# Patient Record
Sex: Male | Born: 1935 | Race: White | Hispanic: No | Smoking: Former smoker
Health system: Southern US, Community
[De-identification: ages and names within clinical notes are randomized; demographics above are authoritative.]

## PROBLEM LIST (undated history)

## (undated) DIAGNOSIS — D510 Vitamin B12 deficiency anemia due to intrinsic factor deficiency: Secondary | ICD-10-CM

## (undated) DIAGNOSIS — K4090 Unilateral inguinal hernia, without obstruction or gangrene, not specified as recurrent: Secondary | ICD-10-CM

## (undated) DIAGNOSIS — E785 Hyperlipidemia, unspecified: Secondary | ICD-10-CM

## (undated) DIAGNOSIS — C61 Malignant neoplasm of prostate: Secondary | ICD-10-CM

## (undated) DIAGNOSIS — J189 Pneumonia, unspecified organism: Secondary | ICD-10-CM

## (undated) DIAGNOSIS — K5792 Diverticulitis of intestine, part unspecified, without perforation or abscess without bleeding: Secondary | ICD-10-CM

## (undated) DIAGNOSIS — F0390 Unspecified dementia without behavioral disturbance: Secondary | ICD-10-CM

## (undated) DIAGNOSIS — I10 Essential (primary) hypertension: Secondary | ICD-10-CM

## (undated) DIAGNOSIS — I2510 Atherosclerotic heart disease of native coronary artery without angina pectoris: Secondary | ICD-10-CM

## (undated) DIAGNOSIS — I7090 Unspecified atherosclerosis: Secondary | ICD-10-CM

## (undated) DIAGNOSIS — R260 Ataxic gait: Secondary | ICD-10-CM

## (undated) DIAGNOSIS — D649 Anemia, unspecified: Secondary | ICD-10-CM

## (undated) DIAGNOSIS — K5790 Diverticulosis of intestine, part unspecified, without perforation or abscess without bleeding: Secondary | ICD-10-CM

## (undated) DIAGNOSIS — Z87442 Personal history of urinary calculi: Secondary | ICD-10-CM

## (undated) HISTORY — DX: Diverticulitis of intestine, part unspecified, without perforation or abscess without bleeding: K57.92

## (undated) HISTORY — DX: Atherosclerotic heart disease of native coronary artery without angina pectoris: I25.10

## (undated) HISTORY — DX: Hyperlipidemia, unspecified: E78.5

## (undated) HISTORY — PX: JOINT REPLACEMENT: SHX530

## (undated) HISTORY — DX: Malignant neoplasm of prostate: C61

## (undated) HISTORY — DX: Pneumonia, unspecified organism: J18.9

## (undated) HISTORY — PX: PROSTATE SURGERY: SHX751

## (undated) HISTORY — DX: Personal history of urinary calculi: Z87.442

## (undated) HISTORY — DX: Diverticulosis of intestine, part unspecified, without perforation or abscess without bleeding: K57.90

## (undated) HISTORY — DX: Essential (primary) hypertension: I10

## (undated) HISTORY — PX: TRANSURETHRAL RESECTION OF PROSTATE: SHX73

---

## 1997-04-02 HISTORY — PX: CORONARY ANGIOPLASTY WITH STENT PLACEMENT: SHX49

## 1997-11-06 ENCOUNTER — Inpatient Hospital Stay (HOSPITAL_COMMUNITY): Admission: AD | Admit: 1997-11-06 | Discharge: 1997-11-09 | Payer: Self-pay | Admitting: Cardiovascular Disease

## 1997-11-23 ENCOUNTER — Encounter (HOSPITAL_COMMUNITY): Admission: RE | Admit: 1997-11-23 | Discharge: 1998-02-21 | Payer: Self-pay | Admitting: Cardiovascular Disease

## 2000-05-27 ENCOUNTER — Encounter (HOSPITAL_COMMUNITY): Admission: RE | Admit: 2000-05-27 | Discharge: 2000-08-25 | Payer: Self-pay | Admitting: Cardiovascular Disease

## 2000-08-26 ENCOUNTER — Encounter (HOSPITAL_COMMUNITY): Admission: RE | Admit: 2000-08-26 | Discharge: 2000-11-24 | Payer: Self-pay | Admitting: Cardiovascular Disease

## 2000-11-25 ENCOUNTER — Encounter (HOSPITAL_COMMUNITY): Admission: RE | Admit: 2000-11-25 | Discharge: 2001-02-23 | Payer: Self-pay | Admitting: Cardiovascular Disease

## 2000-11-27 ENCOUNTER — Encounter: Payer: Self-pay | Admitting: Urology

## 2000-11-27 ENCOUNTER — Ambulatory Visit (HOSPITAL_COMMUNITY): Admission: RE | Admit: 2000-11-27 | Discharge: 2000-11-27 | Payer: Self-pay | Admitting: Urology

## 2001-03-02 ENCOUNTER — Encounter (HOSPITAL_COMMUNITY): Admission: RE | Admit: 2001-03-02 | Discharge: 2001-05-31 | Payer: Self-pay | Admitting: Cardiovascular Disease

## 2001-06-01 ENCOUNTER — Encounter (HOSPITAL_COMMUNITY): Admission: RE | Admit: 2001-06-01 | Discharge: 2001-08-30 | Payer: Self-pay | Admitting: Cardiovascular Disease

## 2001-08-31 ENCOUNTER — Encounter (HOSPITAL_COMMUNITY): Admission: RE | Admit: 2001-08-31 | Discharge: 2001-11-29 | Payer: Self-pay | Admitting: Cardiovascular Disease

## 2001-12-01 ENCOUNTER — Encounter (HOSPITAL_COMMUNITY): Admission: RE | Admit: 2001-12-01 | Discharge: 2002-03-01 | Payer: Self-pay | Admitting: Cardiovascular Disease

## 2002-03-02 ENCOUNTER — Encounter (HOSPITAL_COMMUNITY): Admission: RE | Admit: 2002-03-02 | Discharge: 2002-05-31 | Payer: Self-pay | Admitting: Cardiovascular Disease

## 2002-06-01 ENCOUNTER — Encounter (HOSPITAL_COMMUNITY): Admission: RE | Admit: 2002-06-01 | Discharge: 2002-08-30 | Payer: Self-pay | Admitting: Cardiovascular Disease

## 2002-09-01 ENCOUNTER — Encounter (HOSPITAL_COMMUNITY): Admission: RE | Admit: 2002-09-01 | Discharge: 2002-11-30 | Payer: Self-pay | Admitting: Cardiovascular Disease

## 2002-12-02 ENCOUNTER — Encounter (HOSPITAL_COMMUNITY): Admission: RE | Admit: 2002-12-02 | Discharge: 2003-03-02 | Payer: Self-pay | Admitting: Cardiovascular Disease

## 2003-03-03 ENCOUNTER — Encounter (HOSPITAL_COMMUNITY): Admission: RE | Admit: 2003-03-03 | Discharge: 2003-06-01 | Payer: Self-pay | Admitting: Cardiovascular Disease

## 2003-06-02 ENCOUNTER — Encounter (HOSPITAL_COMMUNITY): Admission: RE | Admit: 2003-06-02 | Discharge: 2003-08-31 | Payer: Self-pay | Admitting: Cardiovascular Disease

## 2003-09-01 ENCOUNTER — Encounter (HOSPITAL_COMMUNITY): Admission: RE | Admit: 2003-09-01 | Discharge: 2003-11-30 | Payer: Self-pay | Admitting: Cardiovascular Disease

## 2003-09-14 ENCOUNTER — Encounter: Admission: RE | Admit: 2003-09-14 | Discharge: 2003-09-14 | Payer: Self-pay | Admitting: Orthopedic Surgery

## 2003-11-11 ENCOUNTER — Inpatient Hospital Stay (HOSPITAL_COMMUNITY): Admission: RE | Admit: 2003-11-11 | Discharge: 2003-11-12 | Payer: Self-pay | Admitting: Orthopedic Surgery

## 2003-12-17 ENCOUNTER — Encounter: Admission: RE | Admit: 2003-12-17 | Discharge: 2003-12-17 | Payer: Self-pay | Admitting: Orthopedic Surgery

## 2004-04-11 ENCOUNTER — Encounter (HOSPITAL_COMMUNITY): Admission: RE | Admit: 2004-04-11 | Discharge: 2004-07-10 | Payer: Self-pay | Admitting: Cardiovascular Disease

## 2004-05-08 ENCOUNTER — Ambulatory Visit (HOSPITAL_COMMUNITY): Admission: RE | Admit: 2004-05-08 | Discharge: 2004-05-08 | Payer: Self-pay | Admitting: Orthopedic Surgery

## 2004-05-08 ENCOUNTER — Ambulatory Visit (HOSPITAL_BASED_OUTPATIENT_CLINIC_OR_DEPARTMENT_OTHER): Admission: RE | Admit: 2004-05-08 | Discharge: 2004-05-08 | Payer: Self-pay | Admitting: Orthopedic Surgery

## 2004-07-31 ENCOUNTER — Encounter (HOSPITAL_COMMUNITY): Admission: RE | Admit: 2004-07-31 | Discharge: 2004-10-29 | Payer: Self-pay | Admitting: Cardiovascular Disease

## 2004-10-31 ENCOUNTER — Encounter (HOSPITAL_COMMUNITY): Admission: RE | Admit: 2004-10-31 | Discharge: 2005-01-29 | Payer: Self-pay | Admitting: Cardiovascular Disease

## 2005-01-26 ENCOUNTER — Ambulatory Visit: Payer: Self-pay | Admitting: Internal Medicine

## 2005-01-29 ENCOUNTER — Ambulatory Visit: Payer: Self-pay | Admitting: Internal Medicine

## 2005-01-31 ENCOUNTER — Encounter (HOSPITAL_COMMUNITY): Admission: RE | Admit: 2005-01-31 | Discharge: 2005-05-01 | Payer: Self-pay | Admitting: Cardiovascular Disease

## 2005-04-02 DIAGNOSIS — J189 Pneumonia, unspecified organism: Secondary | ICD-10-CM

## 2005-04-02 DIAGNOSIS — Z87442 Personal history of urinary calculi: Secondary | ICD-10-CM

## 2005-04-02 HISTORY — DX: Pneumonia, unspecified organism: J18.9

## 2005-04-02 HISTORY — DX: Personal history of urinary calculi: Z87.442

## 2005-05-03 ENCOUNTER — Encounter (HOSPITAL_COMMUNITY): Admission: RE | Admit: 2005-05-03 | Discharge: 2005-08-01 | Payer: Self-pay | Admitting: Cardiovascular Disease

## 2005-08-02 ENCOUNTER — Encounter (HOSPITAL_COMMUNITY): Admission: RE | Admit: 2005-08-02 | Discharge: 2005-10-31 | Payer: Self-pay | Admitting: Cardiovascular Disease

## 2005-11-01 ENCOUNTER — Encounter (HOSPITAL_COMMUNITY): Admission: RE | Admit: 2005-11-01 | Discharge: 2006-01-30 | Payer: Self-pay | Admitting: Cardiovascular Disease

## 2006-01-23 ENCOUNTER — Ambulatory Visit: Payer: Self-pay | Admitting: Internal Medicine

## 2006-01-31 ENCOUNTER — Encounter (HOSPITAL_COMMUNITY): Admission: RE | Admit: 2006-01-31 | Discharge: 2006-05-01 | Payer: Self-pay | Admitting: Cardiovascular Disease

## 2006-02-24 ENCOUNTER — Emergency Department (HOSPITAL_COMMUNITY): Admission: EM | Admit: 2006-02-24 | Discharge: 2006-02-25 | Payer: Self-pay | Admitting: Emergency Medicine

## 2006-02-24 ENCOUNTER — Inpatient Hospital Stay (HOSPITAL_COMMUNITY): Admission: EM | Admit: 2006-02-24 | Discharge: 2006-02-27 | Payer: Self-pay | Admitting: Internal Medicine

## 2006-02-24 ENCOUNTER — Ambulatory Visit: Payer: Self-pay | Admitting: Internal Medicine

## 2006-03-01 ENCOUNTER — Ambulatory Visit (HOSPITAL_BASED_OUTPATIENT_CLINIC_OR_DEPARTMENT_OTHER): Admission: RE | Admit: 2006-03-01 | Discharge: 2006-03-01 | Payer: Self-pay | Admitting: Urology

## 2006-03-07 ENCOUNTER — Ambulatory Visit: Payer: Self-pay | Admitting: Internal Medicine

## 2006-05-03 ENCOUNTER — Encounter (HOSPITAL_COMMUNITY): Admission: RE | Admit: 2006-05-03 | Discharge: 2006-08-01 | Payer: Self-pay | Admitting: Cardiovascular Disease

## 2006-08-02 ENCOUNTER — Encounter (HOSPITAL_COMMUNITY): Admission: RE | Admit: 2006-08-02 | Discharge: 2006-10-31 | Payer: Self-pay | Admitting: Cardiovascular Disease

## 2006-10-01 ENCOUNTER — Ambulatory Visit: Payer: Self-pay | Admitting: Internal Medicine

## 2006-11-01 ENCOUNTER — Encounter (HOSPITAL_COMMUNITY): Admission: RE | Admit: 2006-11-01 | Discharge: 2007-01-30 | Payer: Self-pay | Admitting: Cardiovascular Disease

## 2007-01-15 ENCOUNTER — Ambulatory Visit: Payer: Self-pay | Admitting: Internal Medicine

## 2007-02-01 ENCOUNTER — Encounter (HOSPITAL_COMMUNITY): Admission: RE | Admit: 2007-02-01 | Discharge: 2007-04-01 | Payer: Self-pay | Admitting: Cardiovascular Disease

## 2007-02-05 ENCOUNTER — Encounter: Admission: RE | Admit: 2007-02-05 | Discharge: 2007-02-05 | Payer: Self-pay | Admitting: Orthopedic Surgery

## 2007-04-03 ENCOUNTER — Encounter (HOSPITAL_COMMUNITY): Admission: RE | Admit: 2007-04-03 | Discharge: 2007-06-26 | Payer: Self-pay | Admitting: Cardiovascular Disease

## 2007-05-01 ENCOUNTER — Inpatient Hospital Stay (HOSPITAL_COMMUNITY): Admission: AD | Admit: 2007-05-01 | Discharge: 2007-05-03 | Payer: Self-pay | Admitting: Cardiovascular Disease

## 2007-05-01 ENCOUNTER — Encounter (INDEPENDENT_AMBULATORY_CARE_PROVIDER_SITE_OTHER): Payer: Self-pay | Admitting: Cardiovascular Disease

## 2007-07-02 ENCOUNTER — Encounter (HOSPITAL_COMMUNITY): Admission: RE | Admit: 2007-07-02 | Discharge: 2007-09-30 | Payer: Self-pay | Admitting: Cardiovascular Disease

## 2007-10-01 ENCOUNTER — Encounter (HOSPITAL_COMMUNITY): Admission: RE | Admit: 2007-10-01 | Discharge: 2007-12-30 | Payer: Self-pay | Admitting: Cardiovascular Disease

## 2007-10-03 ENCOUNTER — Emergency Department (HOSPITAL_COMMUNITY): Admission: EM | Admit: 2007-10-03 | Discharge: 2007-10-03 | Payer: Self-pay | Admitting: Emergency Medicine

## 2007-11-20 ENCOUNTER — Encounter: Payer: Self-pay | Admitting: Internal Medicine

## 2007-12-12 ENCOUNTER — Ambulatory Visit: Payer: Self-pay | Admitting: Internal Medicine

## 2007-12-12 DIAGNOSIS — R269 Unspecified abnormalities of gait and mobility: Secondary | ICD-10-CM

## 2007-12-12 DIAGNOSIS — E785 Hyperlipidemia, unspecified: Secondary | ICD-10-CM

## 2007-12-12 DIAGNOSIS — I10 Essential (primary) hypertension: Secondary | ICD-10-CM

## 2007-12-12 DIAGNOSIS — Z8546 Personal history of malignant neoplasm of prostate: Secondary | ICD-10-CM

## 2007-12-12 DIAGNOSIS — I2510 Atherosclerotic heart disease of native coronary artery without angina pectoris: Secondary | ICD-10-CM | POA: Insufficient documentation

## 2007-12-16 ENCOUNTER — Telehealth (INDEPENDENT_AMBULATORY_CARE_PROVIDER_SITE_OTHER): Payer: Self-pay | Admitting: *Deleted

## 2007-12-16 LAB — CONVERTED CEMR LAB
Basophils Absolute: 0 K/uL
Basophils Relative: 1 %
Eosinophils Absolute: 0.1 K/uL
Eosinophils Relative: 1.9 %
HCT: 36.1 % — ABNORMAL LOW
Hemoglobin: 12.4 g/dL — ABNORMAL LOW
Lymphocytes Relative: 29.6 %
MCHC: 34.2 g/dL
MCV: 99.1 fL
Monocytes Absolute: 0.4 K/uL
Monocytes Relative: 10.9 %
Neutro Abs: 2 K/uL
Neutrophils Relative %: 56.6 %
Platelets: 140 K/uL — ABNORMAL LOW
RBC: 3.65 M/uL — ABNORMAL LOW
RDW: 12.8 %
Rheumatoid fact SerPl-aCnc: <20 [IU]/mL — ABNORMAL LOW
Sed Rate: 9 mm/h
Total CK: 93 U/L
Uric Acid, Serum: 5.7 mg/dL
WBC: 3.6 10*3/microliter — ABNORMAL LOW

## 2007-12-17 ENCOUNTER — Ambulatory Visit: Payer: Self-pay | Admitting: Internal Medicine

## 2007-12-20 ENCOUNTER — Encounter: Payer: Self-pay | Admitting: Internal Medicine

## 2007-12-21 LAB — CONVERTED CEMR LAB
Basophils Relative: 1 % (ref 0.0–3.0)
Eosinophils Absolute: 0.1 10*3/uL (ref 0.0–0.7)
Folate: 10.9 ng/mL
Hemoglobin: 12.6 g/dL — ABNORMAL LOW (ref 13.0–17.0)
Iron: 127 ug/dL (ref 42–165)
Monocytes Absolute: 0.5 10*3/uL (ref 0.1–1.0)
RBC: 3.75 M/uL — ABNORMAL LOW (ref 4.22–5.81)
RDW: 12.8 % (ref 11.5–14.6)
Saturation Ratios: 34.4 % (ref 20.0–50.0)
Vitamin B-12: 286 pg/mL (ref 211–911)

## 2007-12-22 ENCOUNTER — Encounter (INDEPENDENT_AMBULATORY_CARE_PROVIDER_SITE_OTHER): Payer: Self-pay | Admitting: *Deleted

## 2007-12-24 LAB — CONVERTED CEMR LAB
Albumin, U: DETECTED %
Free Kappa Lt Chains,Ur: 4.6 mg/dL — ABNORMAL HIGH (ref 0.04–1.51)
Free Lambda Lt Chains,Ur: 0.17 mg/dL (ref 0.08–1.01)

## 2007-12-25 ENCOUNTER — Encounter (INDEPENDENT_AMBULATORY_CARE_PROVIDER_SITE_OTHER): Payer: Self-pay | Admitting: *Deleted

## 2007-12-25 ENCOUNTER — Ambulatory Visit: Payer: Self-pay | Admitting: Internal Medicine

## 2007-12-31 ENCOUNTER — Encounter: Admission: RE | Admit: 2007-12-31 | Discharge: 2007-12-31 | Payer: Self-pay | Admitting: Neurology

## 2008-01-01 ENCOUNTER — Encounter (HOSPITAL_COMMUNITY): Admission: RE | Admit: 2008-01-01 | Discharge: 2008-03-11 | Payer: Self-pay | Admitting: Cardiovascular Disease

## 2008-01-02 ENCOUNTER — Encounter: Payer: Self-pay | Admitting: Internal Medicine

## 2008-01-08 ENCOUNTER — Ambulatory Visit: Payer: Self-pay | Admitting: Internal Medicine

## 2008-01-08 ENCOUNTER — Encounter (INDEPENDENT_AMBULATORY_CARE_PROVIDER_SITE_OTHER): Payer: Self-pay | Admitting: *Deleted

## 2008-01-08 LAB — CONVERTED CEMR LAB: OCCULT 2: NEGATIVE

## 2008-03-02 ENCOUNTER — Encounter: Payer: Self-pay | Admitting: Internal Medicine

## 2008-04-02 DIAGNOSIS — K5790 Diverticulosis of intestine, part unspecified, without perforation or abscess without bleeding: Secondary | ICD-10-CM

## 2008-04-02 HISTORY — DX: Diverticulosis of intestine, part unspecified, without perforation or abscess without bleeding: K57.90

## 2008-05-06 ENCOUNTER — Encounter: Payer: Self-pay | Admitting: Internal Medicine

## 2008-06-08 ENCOUNTER — Ambulatory Visit: Payer: Self-pay | Admitting: Internal Medicine

## 2008-06-09 ENCOUNTER — Encounter (INDEPENDENT_AMBULATORY_CARE_PROVIDER_SITE_OTHER): Payer: Self-pay | Admitting: *Deleted

## 2008-06-14 ENCOUNTER — Ambulatory Visit: Payer: Self-pay | Admitting: Internal Medicine

## 2008-06-14 LAB — CONVERTED CEMR LAB
Ketones, urine, test strip: NEGATIVE
Nitrite: NEGATIVE
WBC Urine, dipstick: NEGATIVE

## 2008-06-15 ENCOUNTER — Encounter: Payer: Self-pay | Admitting: Internal Medicine

## 2008-06-17 ENCOUNTER — Encounter (INDEPENDENT_AMBULATORY_CARE_PROVIDER_SITE_OTHER): Payer: Self-pay | Admitting: *Deleted

## 2008-06-18 ENCOUNTER — Encounter: Payer: Self-pay | Admitting: Internal Medicine

## 2008-06-18 DIAGNOSIS — D61818 Other pancytopenia: Secondary | ICD-10-CM | POA: Insufficient documentation

## 2008-06-18 LAB — CONVERTED CEMR LAB
ALT: 23 units/L (ref 0–53)
AST: 34 units/L (ref 0–37)
Albumin: 3.9 g/dL (ref 3.5–5.2)
Alkaline Phosphatase: 47 units/L (ref 39–117)
Creatinine, Ser: 1 mg/dL (ref 0.4–1.5)
Hemoglobin: 12.8 g/dL — ABNORMAL LOW (ref 13.0–17.0)
MCV: 98.2 fL (ref 78.0–100.0)
RBC: 3.86 M/uL — ABNORMAL LOW (ref 4.22–5.81)
Total Bilirubin: 0.7 mg/dL (ref 0.3–1.2)

## 2008-06-21 ENCOUNTER — Ambulatory Visit: Payer: Self-pay | Admitting: Internal Medicine

## 2008-06-22 ENCOUNTER — Ambulatory Visit: Payer: Self-pay | Admitting: Hematology & Oncology

## 2008-06-23 ENCOUNTER — Encounter (INDEPENDENT_AMBULATORY_CARE_PROVIDER_SITE_OTHER): Payer: Self-pay | Admitting: *Deleted

## 2008-06-23 ENCOUNTER — Telehealth (INDEPENDENT_AMBULATORY_CARE_PROVIDER_SITE_OTHER): Payer: Self-pay | Admitting: *Deleted

## 2008-06-28 ENCOUNTER — Encounter: Payer: Self-pay | Admitting: Internal Medicine

## 2008-06-29 ENCOUNTER — Ambulatory Visit: Payer: Self-pay | Admitting: Internal Medicine

## 2008-07-05 ENCOUNTER — Ambulatory Visit: Payer: Self-pay | Admitting: Internal Medicine

## 2008-07-05 ENCOUNTER — Inpatient Hospital Stay (HOSPITAL_COMMUNITY): Admission: EM | Admit: 2008-07-05 | Discharge: 2008-07-08 | Payer: Self-pay | Admitting: Internal Medicine

## 2008-07-05 ENCOUNTER — Encounter: Payer: Self-pay | Admitting: Internal Medicine

## 2008-07-05 ENCOUNTER — Ambulatory Visit: Payer: Self-pay | Admitting: Diagnostic Radiology

## 2008-07-05 ENCOUNTER — Encounter: Payer: Self-pay | Admitting: Emergency Medicine

## 2008-07-05 DIAGNOSIS — K5732 Diverticulitis of large intestine without perforation or abscess without bleeding: Secondary | ICD-10-CM

## 2008-07-29 ENCOUNTER — Encounter (INDEPENDENT_AMBULATORY_CARE_PROVIDER_SITE_OTHER): Payer: Self-pay | Admitting: *Deleted

## 2008-07-29 ENCOUNTER — Ambulatory Visit: Payer: Self-pay | Admitting: Internal Medicine

## 2008-07-29 DIAGNOSIS — R351 Nocturia: Secondary | ICD-10-CM | POA: Insufficient documentation

## 2008-07-29 DIAGNOSIS — R300 Dysuria: Secondary | ICD-10-CM | POA: Insufficient documentation

## 2008-07-29 DIAGNOSIS — D510 Vitamin B12 deficiency anemia due to intrinsic factor deficiency: Secondary | ICD-10-CM

## 2008-07-30 ENCOUNTER — Encounter: Payer: Self-pay | Admitting: Internal Medicine

## 2008-08-03 ENCOUNTER — Encounter (INDEPENDENT_AMBULATORY_CARE_PROVIDER_SITE_OTHER): Payer: Self-pay | Admitting: *Deleted

## 2008-08-26 ENCOUNTER — Telehealth: Payer: Self-pay | Admitting: Internal Medicine

## 2008-08-26 ENCOUNTER — Ambulatory Visit: Payer: Self-pay | Admitting: Internal Medicine

## 2008-09-23 ENCOUNTER — Ambulatory Visit: Payer: Self-pay | Admitting: Internal Medicine

## 2008-09-23 DIAGNOSIS — E538 Deficiency of other specified B group vitamins: Secondary | ICD-10-CM

## 2008-10-21 ENCOUNTER — Ambulatory Visit: Payer: Self-pay | Admitting: Internal Medicine

## 2008-11-16 ENCOUNTER — Ambulatory Visit: Payer: Self-pay | Admitting: Internal Medicine

## 2008-12-13 ENCOUNTER — Telehealth (INDEPENDENT_AMBULATORY_CARE_PROVIDER_SITE_OTHER): Payer: Self-pay | Admitting: *Deleted

## 2008-12-17 ENCOUNTER — Ambulatory Visit: Payer: Self-pay | Admitting: Internal Medicine

## 2009-01-06 ENCOUNTER — Ambulatory Visit: Payer: Self-pay | Admitting: Internal Medicine

## 2009-01-17 ENCOUNTER — Ambulatory Visit: Payer: Self-pay | Admitting: Internal Medicine

## 2009-02-17 ENCOUNTER — Ambulatory Visit: Payer: Self-pay | Admitting: Internal Medicine

## 2009-02-27 ENCOUNTER — Ambulatory Visit: Payer: Self-pay | Admitting: Internal Medicine

## 2009-02-27 ENCOUNTER — Encounter: Payer: Self-pay | Admitting: Internal Medicine

## 2009-02-27 ENCOUNTER — Inpatient Hospital Stay (HOSPITAL_COMMUNITY): Admission: EM | Admit: 2009-02-27 | Discharge: 2009-03-04 | Payer: Self-pay | Admitting: Internal Medicine

## 2009-02-27 ENCOUNTER — Ambulatory Visit: Payer: Self-pay | Admitting: Diagnostic Radiology

## 2009-02-27 ENCOUNTER — Encounter: Payer: Self-pay | Admitting: Emergency Medicine

## 2009-02-28 ENCOUNTER — Encounter: Payer: Self-pay | Admitting: Gastroenterology

## 2009-03-04 ENCOUNTER — Encounter: Payer: Self-pay | Admitting: Internal Medicine

## 2009-03-16 ENCOUNTER — Emergency Department (HOSPITAL_BASED_OUTPATIENT_CLINIC_OR_DEPARTMENT_OTHER): Admission: EM | Admit: 2009-03-16 | Discharge: 2009-03-16 | Payer: Self-pay | Admitting: Emergency Medicine

## 2009-03-16 ENCOUNTER — Ambulatory Visit: Payer: Self-pay | Admitting: Diagnostic Radiology

## 2009-03-18 ENCOUNTER — Telehealth (INDEPENDENT_AMBULATORY_CARE_PROVIDER_SITE_OTHER): Payer: Self-pay | Admitting: *Deleted

## 2009-03-23 ENCOUNTER — Telehealth (INDEPENDENT_AMBULATORY_CARE_PROVIDER_SITE_OTHER): Payer: Self-pay | Admitting: *Deleted

## 2009-03-23 ENCOUNTER — Ambulatory Visit: Payer: Self-pay | Admitting: Internal Medicine

## 2009-03-29 LAB — CONVERTED CEMR LAB
BUN: 14 mg/dL (ref 6–23)
Creatinine, Ser: 1 mg/dL (ref 0.4–1.5)
Eosinophils Relative: 2.7 % (ref 0.0–5.0)
Lymphocytes Relative: 53.3 % — ABNORMAL HIGH (ref 12.0–46.0)
MCHC: 33 g/dL (ref 30.0–36.0)
Monocytes Relative: 10.4 % (ref 3.0–12.0)
Neutrophils Relative %: 30.2 % — ABNORMAL LOW (ref 43.0–77.0)
Platelets: 119 10*3/uL — ABNORMAL LOW (ref 150.0–400.0)
Potassium: 4.5 meq/L (ref 3.5–5.1)
Saturation Ratios: 28 % (ref 20.0–50.0)
Transferrin: 257.4 mg/dL (ref 212.0–360.0)
Vitamin B-12: >1500 pg/mL — ABNORMAL HIGH (ref 211–911)

## 2009-05-02 ENCOUNTER — Ambulatory Visit: Payer: Self-pay | Admitting: Internal Medicine

## 2009-05-02 DIAGNOSIS — R1033 Periumbilical pain: Secondary | ICD-10-CM | POA: Insufficient documentation

## 2009-05-02 DIAGNOSIS — Z8719 Personal history of other diseases of the digestive system: Secondary | ICD-10-CM

## 2009-05-02 DIAGNOSIS — M2550 Pain in unspecified joint: Secondary | ICD-10-CM | POA: Insufficient documentation

## 2009-05-02 DIAGNOSIS — K5730 Diverticulosis of large intestine without perforation or abscess without bleeding: Secondary | ICD-10-CM | POA: Insufficient documentation

## 2009-05-03 LAB — CONVERTED CEMR LAB
Basophils Absolute: 0 10*3/uL (ref 0.0–0.1)
Basophils Relative: 0.8 % (ref 0.0–3.0)
Eosinophils Absolute: 0.1 10*3/uL (ref 0.0–0.7)
Lymphocytes Relative: 29.3 % (ref 12.0–46.0)
Monocytes Relative: 8.7 % (ref 3.0–12.0)
Neutrophils Relative %: 59.3 % (ref 43.0–77.0)
WBC: 4.7 10*3/uL (ref 4.5–10.5)

## 2009-05-19 ENCOUNTER — Ambulatory Visit: Payer: Self-pay | Admitting: Diagnostic Radiology

## 2009-05-19 ENCOUNTER — Emergency Department (HOSPITAL_BASED_OUTPATIENT_CLINIC_OR_DEPARTMENT_OTHER): Admission: EM | Admit: 2009-05-19 | Discharge: 2009-05-19 | Payer: Self-pay | Admitting: Emergency Medicine

## 2009-05-24 ENCOUNTER — Encounter: Payer: Self-pay | Admitting: Internal Medicine

## 2009-06-02 ENCOUNTER — Encounter: Payer: Self-pay | Admitting: Internal Medicine

## 2009-06-15 ENCOUNTER — Encounter: Payer: Self-pay | Admitting: Internal Medicine

## 2009-07-18 ENCOUNTER — Encounter: Payer: Self-pay | Admitting: Internal Medicine

## 2010-01-30 ENCOUNTER — Telehealth: Payer: Self-pay | Admitting: Internal Medicine

## 2010-02-08 ENCOUNTER — Ambulatory Visit: Payer: Self-pay | Admitting: Internal Medicine

## 2010-02-08 DIAGNOSIS — K5649 Other impaction of intestine: Secondary | ICD-10-CM

## 2010-02-10 LAB — CONVERTED CEMR LAB: TSH: 0.81 microintl units/mL (ref 0.35–5.50)

## 2010-05-03 ENCOUNTER — Encounter: Payer: Self-pay | Admitting: Family

## 2010-05-03 ENCOUNTER — Ambulatory Visit (INDEPENDENT_AMBULATORY_CARE_PROVIDER_SITE_OTHER): Payer: Medicare Other | Admitting: Family

## 2010-05-03 DIAGNOSIS — H6120 Impacted cerumen, unspecified ear: Secondary | ICD-10-CM

## 2010-05-03 DIAGNOSIS — J029 Acute pharyngitis, unspecified: Secondary | ICD-10-CM

## 2010-05-03 DIAGNOSIS — J40 Bronchitis, not specified as acute or chronic: Secondary | ICD-10-CM | POA: Insufficient documentation

## 2010-05-04 NOTE — Assessment & Plan Note (Signed)
Summary: FOR CONSTIPATION//PH   Vital Signs:  Patient profile:   77 year old male Weight:      156.2 pounds Temp:     97.9 degrees F oral Pulse rate:   60 / minute Resp:     18 per minute BP sitting:   120 / 68  (left arm) Cuff size:   regular  Vitals Entered By: Shonna Chock CMA (February 08, 2010 2:26 PM) CC: Constipation x 2-3 days    CC:  Constipation x 2-3 days .  History of Present Illness:      This is a 77 year old male who presents with Constipation since 11/6.  The patient reports hard stools and cramping, but denies rectal pain, abdominal pain, hematochezia, melena, nausea, and vomiting.  The patient denies the following symptoms: fever, weight loss, cold intolerance, and urinary retention.  Effective treatments have included suppositories. He is on Oxybutynin & Tramadol.    Current Medications (verified): 1)  Imdur 60 Mg Xr24h-Tab (Isosorbide Mononitrate) .Marland Kitchen.. 1 By Mouth Once Daily 2)  Lipitor 10 Mg  Tabs (Atorvastatin Calcium) .Marland Kitchen.. 1 By Mouth Qd 3)  Ramipril 10 Mg Caps (Ramipril) .Marland Kitchen.. 1 By Mouth Once Daily 4)  Viadur Implant 5)  Vitamin C 500 Mg Tabs (Ascorbic Acid) .Marland Kitchen.. 1 By Mouth Once Daily 6)  Glucosamine Sulfate 500 Mg Caps (Glucosamine Sulfate) .Marland Kitchen.. 1 By Mouth Once Daily 7)  Plavix 75 Mg Tabs (Clopidogrel Bisulfate) .Marland Kitchen.. 1 By Mouth Once Daily 8)  Norvasc 5 Mg Tabs (Amlodipine Besylate) .Marland Kitchen.. 1 By Mouth Once Daily 9)  Oxybutynin Chloride 10 Mg Xr24h-Tab (Oxybutynin Chloride) .Marland Kitchen.. 1 By Mouth Once Daily 10)  Ecotrin Low Strength 81 Mg Tbec (Aspirin) .Marland Kitchen.. 1 By Mouth Once Daily 11)  Metoprolol Succinate 25 Mg Xr24h-Tab (Metoprolol Succinate) .Marland Kitchen.. 1 1/2 By Mouth Two Times A Day  Allergies: 1)  ! Iodine  Physical Exam  General:  in no acute distress; alert,appropriate and cooperative throughout examination Neck:  No deformities, masses, or tenderness noted. Abdomen:  Bowel sounds positive,abdomen soft  minimally tender suprapubic  without masses, organomegaly or  hernias noted. Rectal:  no external abnormalities and normal sphincter tone.  Hard stool  Neurologic:  DTRs asymmetrical  ; all 0-1/2+ except L biceps 1+. Gait unstable , using cane (in  monitored exercise 3X/ week)    Impression & Recommendations:  Problem # 1:  FECAL IMPACTION (ICD-560.39)  Orders: Venipuncture (04540) TLB-TSH (Thyroid Stimulating Hormone) (84443-TSH)  Complete Medication List: 1)  Imdur 60 Mg Xr24h-tab (Isosorbide mononitrate) .Marland Kitchen.. 1 by mouth once daily 2)  Lipitor 10 Mg Tabs (Atorvastatin calcium) .Marland Kitchen.. 1 by mouth qd 3)  Ramipril 10 Mg Caps (Ramipril) .Marland Kitchen.. 1 by mouth once daily 4)  Viadur Implant  5)  Vitamin C 500 Mg Tabs (Ascorbic acid) .Marland Kitchen.. 1 by mouth once daily 6)  Glucosamine Sulfate 500 Mg Caps (Glucosamine sulfate) .Marland Kitchen.. 1 by mouth once daily 7)  Plavix 75 Mg Tabs (Clopidogrel bisulfate) .Marland Kitchen.. 1 by mouth once daily 8)  Norvasc 5 Mg Tabs (Amlodipine besylate) .Marland Kitchen.. 1 by mouth once daily 9)  Oxybutynin Chloride 10 Mg Xr24h-tab (Oxybutynin chloride) .Marland Kitchen.. 1 by mouth once daily 10)  Ecotrin Low Strength 81 Mg Tbec (Aspirin) .Marland Kitchen.. 1 by mouth once daily 11)  Metoprolol Succinate 25 Mg Xr24h-tab (Metoprolol succinate) .Marland Kitchen.. 1 1/2 by mouth two times a day 12)  Colace 100 Mg Caps (Docusate sodium) .Marland Kitchen.. 1 once daily  Patient Instructions: 1)  Hold Oxybutynin & Tramadol if  possible until stools are normal. Stool softenerer once daily ; Miralax (OTC) once daily X 4 . Fleets Enema after 4 days if no better. Drink to thirst  , up to 40 oz of water daily. Prescriptions: COLACE 100 MG CAPS (DOCUSATE SODIUM) 1 once daily  #30 x 5   Entered and Authorized by:   [name]   Signed by:   [name] on 02/08/2010   Method used:   Print then Give to Patient   RxID:   [id number]    Orders Added: 1)  Est. Patient Level III [14782] 2)  Venipuncture [95621] 3)  TLB-TSH (Thyroid Stimulating Hormone) [30865-HQI]  Appended Document: FOR CONSTIPATION//PH

## 2010-05-04 NOTE — Letter (Signed)
Summary: [facility]  [facility]   Imported By: Lanelle Bal 06/15/2009 08:10:46  _____________________________________________________________________  External Attachment:    Type:   Image     Comment:   External Document

## 2010-05-04 NOTE — Progress Notes (Signed)
Summary: diarrhea  Phone Note Call from Patient   Caller: Patient Summary of Call: patient was constipated (mid sunday afternoon) he took a laxative  bisacodyl - he now has diarrhea & cant stop - wants to know what he can take - he is unable to come to office today - he does not drive - [facility] [address] - [place] Initial call taken by: Okey Regal [place],  January 30, 2010 2:23 PM  Follow-up for Phone Call        Pt denies any abdominal pain/tenderness,fever, or nausea/vomiting. Pt advise he can try immodium or pepto-bismol for diarrhea. Pls advise on any other suggestions..................Marland KitchenFelecia Deloach CMA  January 30, 2010 3:28 PM   Per Dr hopper immodim AD as per package. OV or ER if pain , fever, or blood in stool occurs. Discuss with patient...............Marland KitchenFelecia Deloach CMA  January 30, 2010 4:47 PM

## 2010-05-04 NOTE — Miscellaneous (Signed)
Summary: PT Treatment Plan/[facility]  PT Treatment Plan/[facility] @ [facility]   Imported By: Lanelle Bal 06/23/2009 11:23:58  _____________________________________________________________________  External Attachment:    Type:   Image     Comment:   External Document

## 2010-05-04 NOTE — Miscellaneous (Signed)
Summary: PT & OT Eval Order/[facility]  PT & OT Eval Order/[facility]   Imported By: Lanelle Bal 05/27/2009 12:03:32  _____________________________________________________________________  External Attachment:    Type:   Image     Comment:   External Document

## 2010-05-04 NOTE — Miscellaneous (Signed)
Summary: PT Plan of Progress/[facility] @ [facility]  PT Plan of Progress/[facility] @ [facility]   Imported By: Lanelle Bal 07/25/2009 13:45:09  _____________________________________________________________________  External Attachment:    Type:   Image     Comment:   External Document

## 2010-05-04 NOTE — Assessment & Plan Note (Signed)
Summary: DIVERTICULITIS FLARE/RH.......Marland Kitchen   Vital Signs:  Patient profile:   77 year old male Weight:      152.6 pounds Temp:     98.1 degrees F oral Pulse rate:   60 / minute Resp:     16 per minute BP sitting:   130 / 72  (left arm) Cuff size:   regular  Vitals Entered By: Shonna Chock (May 02, 2009 11:48 AM) CC: 1.) ? Diverticulitis- stomach discomfort x several days  2.) Flu-like Symptoms, Abdominal pain Comments REVIEWED MED LIST, PATIENT AGREED DOSE AND INSTRUCTION CORRECT    CC:  1.) ? Diverticulitis- stomach discomfort x several days  2.) Flu-like Symptoms and Abdominal pain.  History of Present Illness:  Abdominal Pain      This is a 77 year old man who presents with Abdominal pain over past week;w/o trigger . ? improving ;Rx: Tylenol only.  The patient denies nausea, vomiting, diarrhea, constipation, melena, hematochezia, anorexia, and hematemesis.  The location of the pain is periumbilical.  The pain is described as intermittent, cramping in quality, and radiating to the groin.  Associated symptoms include weight loss.  The patient denies the following symptoms: fever, dysuria, chest pain, jaundice, and dark urine. The pain is worse with lying down.  PMH of diverticular bleed  Allergies: 1)  ! Iodine  Past History:  Past Medical History: Prostate cancer, hx of, Dr Marcello Fennel Coronary artery disease Hyperlipidemia Hypertension Diverticulosis, colon with bleed 2010 , [facility]  Past Surgical History: PTCA/stent, 4 vessels 1999,  Dr Allyson Sabal Transurethral resection of prostate, Dr Luanna Salk [place] surgery , Dr Marcello Fennel Colonoscopy 02/28/2009: Severe Diverticulosis , Dr Christella Hartigan  Review of Systems GU:  Denies discharge, dysuria, and hematuria. MS:  Complains of joint pain; Hip & leg pains affect gait; previously in PT.  Physical Exam  General:  Appears younger than age,in no acute distress; alert,appropriate and cooperative throughout examination Eyes:  No corneal or  conjunctival inflammation noted.No icterus Mouth:  Oral mucosa and oropharynx without lesions or exudates.  Teeth in fair  repair; caries present. No pharyngeal erythema.   Lungs:  Normal respiratory effort, chest expands symmetrically. Lungs are clear to auscultation, no crackles or wheezes.Decreased BS @ bases w/o increased WOB Heart:  regular rhythm and bradycardia.  Split S1 Abdomen:  Bowel sounds positive,abdomen soft with minimal tenderness lower abd  without masses, organomegaly or hernias noted. Rectal:  Given stool cards Extremities:  No clubbing, cyanosis, edema. OA hand changes Neurologic:  alert & oriented X3, strength normal in all extremities, and DTRs symmetrical  but 0-1/2 + @ knees. Deliberate gait using cane Skin:  No jaundice Cervical Nodes:  No lymphadenopathy noted Axillary Nodes:  No palpable lymphadenopathy Psych:  memory intact for recent and remote, normally interactive, and good eye contact.     Impression & Recommendations:  Problem # 1:  ABDOMINAL PAIN, PERIUMBILICAL (ICD-789.05)  improving  Orders: Venipuncture ([address]) TLB-CBC Platelet - w/Differential (85025-CBCD)  Problem # 2:  DIVERTICULAR BLEEDING, HX OF (ICD-V12.79)  Orders: Venipuncture (60454) TLB-CBC Platelet - w/Differential (85025-CBCD)  Problem # 3:  ARTHRALGIA (ICD-719.40) with gait impairment  Complete Medication List: 1)  Imdur 60 Mg Xr24h-tab (Isosorbide mononitrate) .Marland Kitchen.. 1 by mouth once daily 2)  Lipitor 10 Mg Tabs (Atorvastatin calcium) .Marland Kitchen.. 1 by mouth qd 3)  Ramipril 10 Mg Caps (Ramipril) .Marland Kitchen.. 1 by mouth once daily 4)  Viadur Implant  5)  Vitamin C 500 Mg Tabs (Ascorbic acid) .Marland Kitchen.. 1 by mouth once daily 6)  Glucosamine Sulfate 500 Mg Caps (Glucosamine sulfate) .Marland Kitchen.. 1 by mouth once daily 7)  Plavix 75 Mg Tabs (Clopidogrel bisulfate) .Marland Kitchen.. 1 by mouth once daily 8)  Norvasc 5 Mg Tabs (Amlodipine besylate) .Marland Kitchen.. 1 by mouth once daily 9)  Oxybutynin Chloride 10 Mg Xr24h-tab (Oxybutynin  chloride) .Marland Kitchen.. 1 by mouth once daily 10)  Ecotrin Low Strength 81 Mg Tbec (Aspirin) .Marland Kitchen.. 1 by mouth once daily 11)  Metoprolol Succinate 25 Mg Xr24h-tab (Metoprolol succinate) .Marland Kitchen.. 1 1/2 by mouth two times a day 12)  Tramadol Hcl 50 Mg Tabs (Tramadol hcl) .... 1/2 -1 q 6hrs as needed pain  Patient Instructions: 1)  Complete stool cards. Physical Therapy to improve gait recommended  2)  Stick with a low residue diet and avoid foods that can irritate your bowels.Please  report Warning Signs(increasing pain, fever, rectal bleeding) Prescriptions: TRAMADOL HCL 50 MG TABS (TRAMADOL HCL) 1/2 -1 q 6hrs as needed pain  #30 x 1   Entered and Authorized by:   [name]   Signed by:   [name] on 05/02/2009   Method used:   Faxed to ...       [facility] [address] (retail)       [address] [place], [place]  [address]       Ph: [phone] or [phone]       Fax: [phone]   RxID:   [id number]

## 2010-05-10 NOTE — Assessment & Plan Note (Signed)
Summary: sore throat//fd--rm 5   Vital Signs:  Patient profile:   77 year old male Weight:      156 pounds Temp:     97.5 degrees F oral Pulse rate:   78 / minute Pulse rhythm:   regular Resp:     18 per minute BP sitting:   150 / 80  (right arm) Cuff size:   regular  Vitals Entered By: Jordan Little CMA Jordan Little) (May 03, 2010 2:15 PM) CC: Pt states he has had a sore throat since Sunday. Is Patient Diabetic? No Pain Assessment Patient in pain? no      Comments Pt agrees all med doses and directions are correct. Jordan Little CMA Jordan Little)  May 03, 2010 2:25 PM    Primary Care Provider:  [name]  CC:  Pt states he has had a sore throat since Sunday.Marland Kitchen  History of Present Illness: [name] is a 77 year old male who presents today with complaint of sore throat and cough x 3 days.  Sore throat is "pretty bad." Able to tolerate food and drink.  Lives at [facility] and tells me that nurse checked him on Sunday and he was afebrile.  Cough is wet and is productive of white phlegm.  Denies associated shortness of breath or chest pain.    Allergies: 1)  ! Iodine  Past History:  Past Medical History: Last updated: 05/02/2009 Prostate cancer, hx of, Jordan Little Coronary artery disease Hyperlipidemia Hypertension Diverticulosis, colon with bleed 2010 , Jordan Little  Past Surgical History: Last updated: 05/02/2009 PTCA/stent, 4 vessels 1999,  Jordan Little Transurethral resection of prostate, Jordan Little CA surgery , Jordan Little Colonoscopy 02/28/2009: Severe Diverticulosis , Jordan Little  Review of Systems       see hpi  Physical Exam  General:  Pleasant, elderly white male, awake, alert and in NAD- seated on exam table Head:  Normocephalic and atraumatic without obvious abnormalities. No apparent alopecia or balding. Ears:  Bilateral cerumen impaction Neck:  No deformities, masses, or tenderness noted. Lungs:  few coarse upper airway rhonchi, breathing  is even and non-labored.  no crackles and no wheezes.   Heart:  Normal rate and regular rhythm. S1 and S2 normal without gallop, murmur, click, rub or other extra sounds. Psych:  Cognition and judgment appear intact. Alert and cooperative with normal attention span and concentration. No apparent delusions, illusions, hallucinations   Impression & Recommendations:  Problem # 1:  BRONCHITIS (ICD-490) Assessment New Rapid strep is negative. Will plan empiric treatment with zithromax for bronchitis.  His updated medication list for this problem includes:    Promethazine-codeine 6.25-10 Mg/40ml Syrp (Promethazine-codeine) .Marland Kitchen... 1 teaspoon every 6 hours as needed    Zithromax Z-pak 250 Mg Tabs (Azithromycin) .Marland Kitchen... 2 tabs by mouth today, then one tablet by mouth daily for 4 additional days  Orders: Prescription Created Electronically [phone])  Problem # 2:  CERUMEN IMPACTION, BILATERAL (ICD-380.4) Assessment: New  Cerumen plug removed bilaterally with use of currette revealing normal TMs.    Orders: Cerumen Impaction Removal (11914)  Complete Medication List: 1)  Imdur 60 Mg Xr24h-tab (Isosorbide mononitrate) .Marland Kitchen.. 1 by mouth once daily 2)  Lipitor 10 Mg Tabs (Atorvastatin calcium) .Marland Kitchen.. 1 by mouth qd 3)  Ramipril 10 Mg Caps (Ramipril) .Marland Kitchen.. 1 by mouth once daily 4)  Viadur Implant  5)  Vitamin C 500 Mg Tabs (Ascorbic acid) .Marland Kitchen.. 1 by mouth once daily 6)  Glucosamine Sulfate 500 Mg Caps (  Glucosamine sulfate) .Marland Kitchen.. 1 by mouth once daily 7)  Plavix 75 Mg Tabs (Clopidogrel bisulfate) .Marland Kitchen.. 1 by mouth once daily 8)  Norvasc 5 Mg Tabs (Amlodipine besylate) .Marland Kitchen.. 1 by mouth once daily 9)  Oxybutynin Chloride 10 Mg Xr24h-tab (Oxybutynin chloride) .Marland Kitchen.. 1 by mouth once daily 10)  Ecotrin Low Strength 81 Mg Tbec (Aspirin) .Marland Kitchen.. 1 by mouth once daily 11)  Metoprolol Succinate 25 Mg Xr24h-tab (Metoprolol succinate) .Marland Kitchen.. 1 1/2 by mouth two times a day 12)  Colace 100 Mg Caps (Docusate sodium) .Marland Kitchen.. 1 once  daily 13)  Promethazine-codeine 6.25-10 Mg/27ml Syrp (Promethazine-codeine) .Marland Kitchen.. 1 teaspoon every 6 hours as needed 14)  Zithromax Z-pak 250 Mg Tabs (Azithromycin) .... 2 tabs by mouth today, then one tablet by mouth daily for 4 additional days  Other Orders: Rapid Strep (69629)  Patient Instructions: 1)  Gargle twice daily with salt water. 2)  Take Tylenol 650mg  every 6 hours as needed for pain 3)  You may use over the counter Cepacol lozenges or Chloraseptic spray as needed for sore throat. 4)  Make sure that you drink 6-8 glasses of water a day to prevent dehydration. 5)  Call us if your symptoms worsen, if you develop fever or weakness, or if your symptoms are not improved in 48-72 hours. 6)  Please follow up with Jordan. Alwyn Little in 1-2 weeks. Prescriptions: ZITHROMAX Z-PAK 250 MG TABS (AZITHROMYCIN) 2 tabs by mouth today, then one tablet by mouth daily for 4 additional days  #1 pack x 0   Entered and Authorized by:   Jordan Fillers FNP   Signed by:   Jordan Fillers FNP on 05/03/2010   Method used:   Electronically to        [facility] [address] (retail)       [address]       [place], [place]  [address]       Ph: [phone]       Fax: [phone]   RxID:   [id number]    Orders Added: 1)  Rapid Strep [51884] 2)  Est. Patient Level III [16606] 3)  Cerumen Impaction Removal [30160] 4)  Prescription Created Electronically [phone]    Current Allergies (reviewed today): ! IODINE  Laboratory Results   Date/Time Reported: Jordan Little CMA (AAMA)  May 03, 2010 2:36 PM   Other Tests  Rapid Strep: negative  Kit Test Internal QC: Positive   (Normal Range: Negative)

## 2010-05-11 ENCOUNTER — Ambulatory Visit (INDEPENDENT_AMBULATORY_CARE_PROVIDER_SITE_OTHER): Payer: Medicare Other | Admitting: Internal Medicine

## 2010-05-11 ENCOUNTER — Encounter: Payer: Self-pay | Admitting: Internal Medicine

## 2010-05-11 DIAGNOSIS — K219 Gastro-esophageal reflux disease without esophagitis: Secondary | ICD-10-CM | POA: Insufficient documentation

## 2010-05-11 DIAGNOSIS — J40 Bronchitis, not specified as acute or chronic: Secondary | ICD-10-CM

## 2010-05-11 DIAGNOSIS — K5900 Constipation, unspecified: Secondary | ICD-10-CM | POA: Insufficient documentation

## 2010-05-18 NOTE — Assessment & Plan Note (Signed)
Summary: f/u sore throat/cbs   Vital Signs:  Patient profile:   77 year old male Weight:      151 pounds O2 Sat:      97 % on Room air Temp:     98.1 degrees F oral Pulse rate:   53 / minute Resp:     15 per minute BP sitting:   130 / 62  (left arm)  Vitals Entered By: [name] CMA (May 11, 2010 1:41 PM)  O2 Flow:  Room air CC: f/u sore throat, URI symptoms, Heartburn   Primary Care Provider:  Marga Melnick Little  CC:  f/u sore throat, URI symptoms, and Heartburn.  History of Present Illness:      This is a 77 year old man who presents  for F/U of  URI symptoms.  The patient now reports nasal congestion and  scant  producive cough , but denies purulent nasal discharge, sore throat, and earache.  The patient denies fever, dyspnea, and wheezing.  The patient denies itchy watery eyes, sneezing, and headache.  The patient denies the following risk factors for Strep sinusitis: unilateral facial pain and tender adenopathy.       The patient reports sour taste in mouth & constipation, but he  denies acid reflux, epigastric pain,diarrhea  chest pain, and trouble swallowing.  The patient denies the following alarm features: melena, dysphagia, hematemesis, and vomiting.   Occasional LLQ pain , "every 3 weeks"  Current Medications (verified): 1)  Imdur 60 Mg Xr24h-Tab (Isosorbide Mononitrate) .Marland Kitchen.. 1 By Mouth Once Daily 2)  Lipitor 10 Mg  Tabs (Atorvastatin Calcium) .Marland Kitchen.. 1 By Mouth Qd 3)  Ramipril 10 Mg Caps (Ramipril) .Marland Kitchen.. 1 By Mouth Once Daily 4)  Viadur Implant 5)  Vitamin C 500 Mg Tabs (Ascorbic Acid) .Marland Kitchen.. 1 By Mouth Once Daily 6)  Glucosamine Sulfate 500 Mg Caps (Glucosamine Sulfate) .Marland Kitchen.. 1 By Mouth Once Daily 7)  Plavix 75 Mg Tabs (Clopidogrel Bisulfate) .Marland Kitchen.. 1 By Mouth Once Daily 8)  Norvasc 5 Mg Tabs (Amlodipine Besylate) .Marland Kitchen.. 1 By Mouth Once Daily 9)  Oxybutynin Chloride 10 Mg Xr24h-Tab (Oxybutynin Chloride) .Marland Kitchen.. 1 By Mouth Once Daily 10)  Ecotrin Low Strength 81 Mg Tbec  (Aspirin) .Marland Kitchen.. 1 By Mouth Once Daily 11)  Metoprolol Succinate 25 Mg Xr24h-Tab (Metoprolol Succinate) .Marland Kitchen.. 1 1/2 By Mouth Two Times A Day 12)  Colace 100 Mg Caps (Docusate Sodium) .Marland Kitchen.. 1 Once Daily  Allergies (verified): 1)  ! Iodine  Physical Exam  General:  in no acute distress; alert,appropriate and cooperative throughout examination Ears:  External ear exam shows no significant lesions or deformities.  Otoscopic examination reveals clear canals, tympanic membranes are intact bilaterally without bulging, retraction, inflammation or discharge. Hearing is grossly normal bilaterally. Some wax on L Nose:  External nasal examination shows no deformity or inflammation. Nasal mucosa are pink and moist without lesions or exudates. Mouth:  Oral mucosa and oropharynx without lesions or exudates.   Mild pharyngeal erythema.   Lungs:  Normal respiratory effort, chest expands symmetrically. Lungs are clear to auscultation, no crackles or wheezes. Heart:  Normal rate and regular rhythm. S1 and S2 normal without gallop, murmur, click, rub.S4 with slurring Abdomen:  Bowel sounds positive,abdomen soft and non-tender without masses, organomegaly or hernias noted. Bowel sounds heard in L chest Cervical Nodes:  No lymphadenopathy noted Axillary Nodes:  No palpable lymphadenopathy   Impression & Recommendations:  Problem # 1:  BRONCHITIS (ICD-490) resolved The following medications were removed  from the medication list:    Promethazine-codeine 6.25-10 Mg/39ml Syrp (Promethazine-codeine) .Marland Kitchen... 1 teaspoon every 6 hours as needed    Zithromax Z-pak 250 Mg Tabs (Azithromycin) .Marland Kitchen... 2 tabs by mouth today, then one tablet by mouth daily for 4 additional days  Problem # 2:  CONSTIPATION (ICD-564.00)  His updated medication list for this problem includes:    Colace 100 Mg Caps (Docusate sodium) .Marland Kitchen... 1 once daily  Orders: Prescription Created Electronically [phone])  Problem # 3:  GERD  (ICD-530.81)  clinically suggested  His updated medication list for this problem includes:    Ranitidine Hcl 150 Mg Tabs (Ranitidine hcl) .Marland Kitchen... 1 two times a day pre meals  Complete Medication List: 1)  Imdur 60 Mg Xr24h-tab (Isosorbide mononitrate) .Marland Kitchen.. 1 by mouth once daily 2)  Lipitor 10 Mg Tabs (Atorvastatin calcium) .Marland Kitchen.. 1 by mouth qd 3)  Ramipril 10 Mg Caps (Ramipril) .Marland Kitchen.. 1 by mouth once daily 4)  Viadur Implant  5)  Vitamin C 500 Mg Tabs (Ascorbic acid) .Marland Kitchen.. 1 by mouth once daily 6)  Glucosamine Sulfate 500 Mg Caps (Glucosamine sulfate) .Marland Kitchen.. 1 by mouth once daily 7)  Plavix 75 Mg Tabs (Clopidogrel bisulfate) .Marland Kitchen.. 1 by mouth once daily 8)  Norvasc 5 Mg Tabs (Amlodipine besylate) .Marland Kitchen.. 1 by mouth once daily 9)  Oxybutynin Chloride 10 Mg Xr24h-tab (Oxybutynin chloride) .Marland Kitchen.. 1 by mouth once daily 10)  Ecotrin Low Strength 81 Mg Tbec (Aspirin) .Marland Kitchen.. 1 by mouth once daily 11)  Metoprolol Succinate 25 Mg Xr24h-tab (Metoprolol succinate) .Marland Kitchen.. 1 1/2 by mouth two times a day 12)  Colace 100 Mg Caps (Docusate sodium) .Marland Kitchen.. 1 once daily 13)  Ranitidine Hcl 150 Mg Tabs (Ranitidine hcl) .Marland Kitchen.. 1 two times a day pre meals  Patient Instructions: 1)  Miralax every 3rd as needed for constipation. Please complete stool cards. Prescriptions: RANITIDINE HCL 150 MG TABS (RANITIDINE HCL) 1 two times a day pre meals  #60 x 2   Entered and Authorized by:   Jordan Little   Signed by:   Jordan Little on 05/11/2010   Method used:   Electronically to        [facility] (retail)       [address]       [place], [place]  [address]       Ph: [phone]       Fax: [phone]   RxID:   [id number]    Orders Added: 1)  Est. Patient Level III [44034] 2)  Prescription Created Electronically [phone]

## 2010-06-01 ENCOUNTER — Encounter: Payer: Self-pay | Admitting: Internal Medicine

## 2010-06-21 LAB — CBC
HCT: 33.4 % — ABNORMAL LOW (ref 39.0–52.0)
Hemoglobin: 11.2 g/dL — ABNORMAL LOW (ref 13.0–17.0)
MCV: 96.7 fL (ref 78.0–100.0)
RDW: 12.7 % (ref 11.5–15.5)

## 2010-06-21 LAB — DIFFERENTIAL
Eosinophils Absolute: 0 10*3/uL (ref 0.0–0.7)
Eosinophils Relative: 1 % (ref 0–5)
Lymphs Abs: 1.2 10*3/uL (ref 0.7–4.0)
Monocytes Absolute: 0.4 10*3/uL (ref 0.1–1.0)
Monocytes Relative: 8 % (ref 3–12)

## 2010-06-21 LAB — BASIC METABOLIC PANEL
BUN: 18 mg/dL (ref 6–23)
CO2: 27 mEq/L (ref 19–32)
Chloride: 109 mEq/L (ref 96–112)
GFR calc non Af Amer: >60 mL/min (ref >60)
Glucose, Bld: 122 mg/dL — ABNORMAL HIGH (ref 70–99)
Potassium: 4.3 mEq/L (ref 3.5–5.1)
Sodium: 144 mEq/L (ref 135–145)

## 2010-07-04 LAB — CBC
HCT: 26.7 % — ABNORMAL LOW (ref 39.0–52.0)
HCT: 26.8 % — ABNORMAL LOW (ref 39.0–52.0)
MCHC: 34.4 g/dL (ref 30.0–36.0)
Platelets: 112 10*3/uL — ABNORMAL LOW (ref 150–400)
Platelets: 91 10*3/uL — ABNORMAL LOW (ref 150–400)
Platelets: 99 10*3/uL — ABNORMAL LOW (ref 150–400)
RBC: 2.89 MIL/uL — ABNORMAL LOW (ref 4.22–5.81)
RDW: 14.2 % (ref 11.5–15.5)
RDW: 14.3 % (ref 11.5–15.5)
WBC: 4.4 10*3/uL (ref 4.0–10.5)
WBC: 4.8 10*3/uL (ref 4.0–10.5)

## 2010-07-04 LAB — BASIC METABOLIC PANEL
BUN: 13 mg/dL (ref 6–23)
CO2: 23 mEq/L (ref 19–32)
Calcium: 8.7 mg/dL (ref 8.4–10.5)
Chloride: 117 mEq/L — ABNORMAL HIGH (ref 96–112)
GFR calc Af Amer: >60 mL/min (ref >60)
GFR calc non Af Amer: 57 mL/min — ABNORMAL LOW (ref >60)
Glucose, Bld: 116 mg/dL — ABNORMAL HIGH (ref 70–99)
Potassium: 3.9 mEq/L (ref 3.5–5.1)
Potassium: 4.1 mEq/L (ref 3.5–5.1)
Sodium: 143 mEq/L (ref 135–145)

## 2010-07-04 LAB — MAGNESIUM: Magnesium: 1.9 mg/dL (ref 1.5–2.5)

## 2010-07-04 LAB — CARDIAC PANEL(CRET KIN+CKTOT+MB+TROPI)
CK, MB: 3.6 ng/mL (ref 0.3–4.0)
CK, MB: 3.8 ng/mL (ref 0.3–4.0)
Relative Index: 1.7 (ref 0.0–2.5)
Total CK: 232 U/L (ref 7–232)
Troponin I: 0.02 ng/mL (ref 0.00–0.06)
Troponin I: 0.03 ng/mL (ref 0.00–0.06)

## 2010-07-05 LAB — TYPE AND SCREEN: Antibody Screen: NEGATIVE

## 2010-07-05 LAB — CBC
HCT: 22.1 % — ABNORMAL LOW (ref 39.0–52.0)
HCT: 22.9 % — ABNORMAL LOW (ref 39.0–52.0)
HCT: 26.6 % — ABNORMAL LOW (ref 39.0–52.0)
HCT: 30.2 % — ABNORMAL LOW (ref 39.0–52.0)
HCT: 30.6 % — ABNORMAL LOW (ref 39.0–52.0)
HCT: 35.2 % — ABNORMAL LOW (ref 39.0–52.0)
Hemoglobin: 10.3 g/dL — ABNORMAL LOW (ref 13.0–17.0)
Hemoglobin: 7.6 g/dL — ABNORMAL LOW (ref 13.0–17.0)
Hemoglobin: 8 g/dL — ABNORMAL LOW (ref 13.0–17.0)
Hemoglobin: 9.1 g/dL — ABNORMAL LOW (ref 13.0–17.0)
Hemoglobin: 12 g/dL — ABNORMAL LOW (ref 13.0–17.0)
MCHC: 34.3 g/dL (ref 30.0–36.0)
MCHC: 34.4 g/dL (ref 30.0–36.0)
MCHC: 34.9 g/dL (ref 30.0–36.0)
MCHC: 34 g/dL (ref 30.0–36.0)
MCV: 97.7 fL (ref 78.0–100.0)
MCV: 97.8 fL (ref 78.0–100.0)
MCV: 98.7 fL (ref 78.0–100.0)
MCV: 97.8 fL (ref 78.0–100.0)
Platelets: 78 10*3/uL — ABNORMAL LOW (ref 150–400)
Platelets: 79 10*3/uL — ABNORMAL LOW (ref 150–400)
Platelets: 93 10*3/uL — ABNORMAL LOW (ref 150–400)
Platelets: 99 10*3/uL — ABNORMAL LOW (ref 150–400)
Platelets: 99 10*3/uL — ABNORMAL LOW (ref 150–400)
Platelets: 108 10*3/uL — ABNORMAL LOW (ref 150–400)
RBC: 2.24 MIL/uL — ABNORMAL LOW (ref 4.22–5.81)
RBC: 2.34 MIL/uL — ABNORMAL LOW (ref 4.22–5.81)
RBC: 2.72 MIL/uL — ABNORMAL LOW (ref 4.22–5.81)
RBC: 3.08 MIL/uL — ABNORMAL LOW (ref 4.22–5.81)
RBC: 3.25 MIL/uL — ABNORMAL LOW (ref 4.22–5.81)
RBC: 3.6 MIL/uL — ABNORMAL LOW (ref 4.22–5.81)
RDW: 13.3 % (ref 11.5–15.5)
RDW: 13.4 % (ref 11.5–15.5)
RDW: 13.5 % (ref 11.5–15.5)
RDW: 13.7 % (ref 11.5–15.5)
RDW: 13.9 % (ref 11.5–15.5)
RDW: 13.1 % (ref 11.5–15.5)
WBC: 3.9 10*3/uL — ABNORMAL LOW (ref 4.0–10.5)
WBC: 5.1 10*3/uL (ref 4.0–10.5)
WBC: 5.6 10*3/uL (ref 4.0–10.5)
WBC: 5.6 10*3/uL (ref 4.0–10.5)
WBC: 6.2 10*3/uL (ref 4.0–10.5)
WBC: 6.5 10*3/uL (ref 4.0–10.5)
WBC: 6.5 10*3/uL (ref 4.0–10.5)

## 2010-07-05 LAB — COMPREHENSIVE METABOLIC PANEL
ALT: 15 U/L (ref 0–53)
AST: 20 U/L (ref 0–37)
Albumin: 3.3 g/dL — ABNORMAL LOW (ref 3.5–5.2)
Alkaline Phosphatase: 43 U/L (ref 39–117)
BUN: 14 mg/dL (ref 6–23)
CO2: 24 mEq/L (ref 19–32)
Calcium: 8.4 mg/dL (ref 8.4–10.5)
Chloride: 111 mEq/L (ref 96–112)
Creatinine, Ser: 0.79 mg/dL (ref 0.4–1.5)
GFR calc Af Amer: >60 mL/min (ref >60)
GFR calc non Af Amer: >60 mL/min (ref >60)
Glucose, Bld: 116 mg/dL — ABNORMAL HIGH (ref 70–99)
Potassium: 3.9 mEq/L (ref 3.5–5.1)
Sodium: 139 mEq/L (ref 135–145)
Total Bilirubin: 1.1 mg/dL (ref 0.3–1.2)
Total Protein: 5.4 g/dL — ABNORMAL LOW (ref 6.0–8.3)

## 2010-07-05 LAB — DIFFERENTIAL
Basophils Absolute: 0 10*3/uL (ref 0.0–0.1)
Basophils Relative: 1 % (ref 0–1)
Eosinophils Absolute: 0.1 10*3/uL (ref 0.0–0.7)
Eosinophils Relative: 2 % (ref 0–5)
Lymphocytes Relative: 24 % (ref 12–46)
Lymphs Abs: 1.5 10*3/uL (ref 0.7–4.0)
Monocytes Absolute: 0.7 10*3/uL (ref 0.1–1.0)
Monocytes Relative: 10 % (ref 3–12)
Neutro Abs: 4.2 10*3/uL (ref 1.7–7.7)
Neutrophils Relative %: 63 % (ref 43–77)

## 2010-07-05 LAB — URINALYSIS, ROUTINE W REFLEX MICROSCOPIC
Glucose, UA: NEGATIVE mg/dL
Glucose, UA: NEGATIVE mg/dL
Ketones, ur: NEGATIVE mg/dL
Nitrite: NEGATIVE
Nitrite: NEGATIVE
Protein, ur: NEGATIVE mg/dL
Urobilinogen, UA: 0.2 mg/dL (ref 0.0–1.0)
pH: 6 (ref 5.0–8.0)

## 2010-07-05 LAB — GLUCOSE, CAPILLARY
Glucose-Capillary: 107 mg/dL — ABNORMAL HIGH (ref 70–99)
Glucose-Capillary: 109 mg/dL — ABNORMAL HIGH (ref 70–99)
Glucose-Capillary: 111 mg/dL — ABNORMAL HIGH (ref 70–99)
Glucose-Capillary: 111 mg/dL — ABNORMAL HIGH (ref 70–99)
Glucose-Capillary: 129 mg/dL — ABNORMAL HIGH (ref 70–99)
Glucose-Capillary: 97 mg/dL (ref 70–99)

## 2010-07-05 LAB — CARDIAC PANEL(CRET KIN+CKTOT+MB+TROPI)
CK, MB: 4.8 ng/mL — ABNORMAL HIGH (ref 0.3–4.0)
Total CK: 263 U/L — ABNORMAL HIGH (ref 7–232)

## 2010-07-05 LAB — IRON AND TIBC
Saturation Ratios: 35 % (ref 20–55)
TIBC: 277 ug/dL (ref 215–435)
UIBC: 179 ug/dL

## 2010-07-05 LAB — BASIC METABOLIC PANEL
BUN: 24 mg/dL — ABNORMAL HIGH (ref 6–23)
CO2: 23 mEq/L (ref 19–32)
CO2: 22 mEq/L (ref 19–32)
Calcium: 8.1 mg/dL — ABNORMAL LOW (ref 8.4–10.5)
Calcium: 9 mg/dL (ref 8.4–10.5)
Chloride: 111 mEq/L (ref 96–112)
Creatinine, Ser: 1 mg/dL (ref 0.4–1.5)
GFR calc Af Amer: >60 mL/min (ref >60)
GFR calc Af Amer: >60 mL/min (ref >60)
GFR calc non Af Amer: >60 mL/min (ref >60)
GFR calc non Af Amer: >60 mL/min (ref >60)
Glucose, Bld: 110 mg/dL — ABNORMAL HIGH (ref 70–99)
Potassium: 4.4 mEq/L (ref 3.5–5.1)
Sodium: 140 mEq/L (ref 135–145)
Sodium: 143 mEq/L (ref 135–145)

## 2010-07-05 LAB — POCT CARDIAC MARKERS: Troponin i, poc: <0.05 ng/mL (ref 0.00–0.09)

## 2010-07-05 LAB — HEMOCCULT GUIAC POC 1CARD (OFFICE): Fecal Occult Bld: POSITIVE

## 2010-07-05 LAB — VITAMIN B12: Vitamin B-12: 649 pg/mL (ref 211–911)

## 2010-07-05 LAB — PROTIME-INR: INR: 0.98 (ref 0.00–1.49)

## 2010-07-05 LAB — ABO/RH: ABO/RH(D): O POS

## 2010-07-05 LAB — RETICULOCYTES: Retic Ct Pct: 1.1 % (ref 0.4–3.1)

## 2010-07-05 LAB — TSH: TSH: 1.487 u[IU]/mL (ref 0.350–4.500)

## 2010-07-12 LAB — BASIC METABOLIC PANEL
BUN: 19 mg/dL (ref 6–23)
CO2: 23 mEq/L (ref 19–32)
Calcium: 9.6 mg/dL (ref 8.4–10.5)
Chloride: 110 mEq/L (ref 96–112)
Creatinine, Ser: 0.9 mg/dL (ref 0.4–1.5)
GFR calc Af Amer: >60 mL/min (ref >60)
GFR calc Af Amer: >60 mL/min (ref >60)
Potassium: 4.1 mEq/L (ref 3.5–5.1)
Sodium: 138 mEq/L (ref 135–145)

## 2010-07-12 LAB — DIFFERENTIAL
Basophils Relative: 1 % (ref 0–1)
Eosinophils Absolute: 0 10*3/uL (ref 0.0–0.7)
Monocytes Relative: 2 % — ABNORMAL LOW (ref 3–12)
Neutro Abs: 3.4 10*3/uL (ref 1.7–7.7)
Neutrophils Relative %: 66 % (ref 43–77)

## 2010-07-12 LAB — HEPATIC FUNCTION PANEL
ALT: 20 U/L (ref 0–53)
Alkaline Phosphatase: 35 U/L — ABNORMAL LOW (ref 39–117)
Indirect Bilirubin: 0.7 mg/dL (ref 0.3–0.9)
Total Bilirubin: 1 mg/dL (ref 0.3–1.2)

## 2010-07-12 LAB — CBC
HCT: 32.7 % — ABNORMAL LOW (ref 39.0–52.0)
Hemoglobin: 11.1 g/dL — ABNORMAL LOW (ref 13.0–17.0)
MCHC: 34.1 g/dL (ref 30.0–36.0)
MCHC: 33.4 g/dL (ref 30.0–36.0)
MCV: 98.5 fL (ref 78.0–100.0)
MCV: 98.8 fL (ref 78.0–100.0)
Platelets: 123 10*3/uL — ABNORMAL LOW (ref 150–400)
RBC: 3.32 MIL/uL — ABNORMAL LOW (ref 4.22–5.81)
RBC: 4.03 MIL/uL — ABNORMAL LOW (ref 4.22–5.81)
WBC: 9.1 10*3/uL (ref 4.0–10.5)
WBC: 5 10*3/uL (ref 4.0–10.5)

## 2010-07-12 LAB — URINALYSIS, ROUTINE W REFLEX MICROSCOPIC
Leukocytes, UA: NEGATIVE
Nitrite: NEGATIVE
Protein, ur: NEGATIVE mg/dL
Specific Gravity, Urine: 1.018 (ref 1.005–1.030)
Urobilinogen, UA: 1 mg/dL (ref 0.0–1.0)

## 2010-07-12 LAB — URINE MICROSCOPIC-ADD ON

## 2010-07-18 ENCOUNTER — Ambulatory Visit (INDEPENDENT_AMBULATORY_CARE_PROVIDER_SITE_OTHER): Payer: Medicare Other | Admitting: Internal Medicine

## 2010-07-18 ENCOUNTER — Encounter: Payer: Medicare Other | Admitting: Internal Medicine

## 2010-07-18 ENCOUNTER — Encounter: Payer: Self-pay | Admitting: Internal Medicine

## 2010-07-18 VITALS — BP 104/60 | HR 64 | Temp 97.8°F | Resp 14 | Ht 66.0 in | Wt 152.8 lb

## 2010-07-18 DIAGNOSIS — M25559 Pain in unspecified hip: Secondary | ICD-10-CM

## 2010-07-18 DIAGNOSIS — Z0000 Encounter for general adult medical examination without abnormal findings: Secondary | ICD-10-CM

## 2010-07-18 DIAGNOSIS — E538 Deficiency of other specified B group vitamins: Secondary | ICD-10-CM

## 2010-07-18 DIAGNOSIS — M25551 Pain in right hip: Secondary | ICD-10-CM

## 2010-07-18 DIAGNOSIS — Z136 Encounter for screening for cardiovascular disorders: Secondary | ICD-10-CM

## 2010-07-18 DIAGNOSIS — E785 Hyperlipidemia, unspecified: Secondary | ICD-10-CM

## 2010-07-18 DIAGNOSIS — I10 Essential (primary) hypertension: Secondary | ICD-10-CM

## 2010-07-18 DIAGNOSIS — D61818 Other pancytopenia: Secondary | ICD-10-CM

## 2010-07-18 MED ORDER — TRAMADOL HCL 50 MG PO TABS: 50.0000 mg | ORAL_TABLET | Freq: Four times a day (QID) | ORAL | Status: DC | PRN

## 2010-07-18 NOTE — Progress Notes (Signed)
Subjective:    Patient ID: Jordan Little, male    DOB: 1915-06-28, 77 y.o.   MRN: [id number]  HPI Medicare Wellness Visit:  The following psychosocial & medical history were reviewed as required by Medicare.   Social history: caffeine 2 cups/ day , alcohol no ,  tobacco use quit cigars 1972  & exercise classes 3 X/week.   Home & personal  Safety: he resides  in retirement center, activities of daily living; no limitations( not driving) , seatbelt use yes , and smoke alarms in center .  Power of AttorneyLliving Will status  In place  Hearing and vision evaluation  Whisper heard @ 6 ft. Orientation oriented X 3  , memory & recall normal,  WORLD  spelled  backward,and mood & affect   normal  Travel history [place] 1954 , immunization status  Up to date  , transfusion history yes for GI bleed ( Diverticulitis), and preventive health surveillance  Up to date, Dental care  Every 6 mos . Chart reviewed &  Updated. Active issues reviewed & addressed.       Review of Systems He has been awakening intermittently in the mornings with tingling in his upper extremities.  He denies headaches, tremor, weakness, dizziness vertigo  or falling. He does use a cane for ambulation. He has had pain in the morning which gradually improves without medication.    Objective:   Physical Exam Gen.: Healthy and well-nourished in appearance. Alert, appropriate and cooperative throughout exam. Head: Normocephalic without obvious abnormalities.Hair thin.Eyes: No corneal or conjunctival inflammation noted. Pupils equal round reactive to light and accommodation. Extraocular motion intact.  Ears: External  ear exam reveals no significant lesions or deformities. Canals clear .TMs normal. Hearing is grossly normal bilaterally. Nose: External nasal exam reveals no deformity or inflammation. Nasal mucosa are pink and moist. No lesions or exudates noted. Mouth: Oral mucosa and oropharynx reveal no lesions or exudates. Teeth in  good repair. Neck: No deformities, masses, or tenderness noted. Range of motion  normal. Thyroid  No nodules. Lungs: Normal respiratory effort; chest expands symmetrically. Lungs are clear to auscultation without rales, wheezes, or increased work of breathing. Heart: Slow rate and rhythm. Normal S1 and S2. No gallop, click, or rub. S4 with slurring; no  murmur. Abdomen: Bowel sounds normal; abdomen soft and nontender. No masses, organomegaly or hernias noted.                                                                                    Musculoskeletal/extremities:Scoliosis noted of  the thoracolumbar spine. No clubbing, cyanosis, edema.OA finger changes  noted. Range of motion  Normal but pain with ROM L hip .Tone & strength  normal. Nail health  good. Vascular: Carotid, radial artery, dorsalis pedis and dorsalis posterior tibial pulses are full and equal. ? Faint intermittent bruit R carotid Neurologic: Alert and oriented x3. Deep tendon reflexes symmetrical and normal.         Skin: Intact without suspicious lesions or rashes. Lymph: No cervical, axillary, or inguinal lymphadenopathy present. Psych: Mood and affect are normal. Normally interactive  Assessment & Plan:   #1 Medicare wellness visit; required then entered  #2 bilateral hip pain  #3 sinus bradycardia, asymptomatic  Plan: #1 tramadol will be ordered for the hip pain.

## 2010-07-18 NOTE — Patient Instructions (Signed)
Report response to the Tramadol.

## 2010-07-19 LAB — CBC WITH DIFFERENTIAL/PLATELET
Basophils Absolute: 0 10*3/uL (ref 0.0–0.1)
Eosinophils Absolute: 0.1 10*3/uL (ref 0.0–0.7)
HCT: 35.6 % — ABNORMAL LOW (ref 39.0–52.0)
Hemoglobin: 12.1 g/dL — ABNORMAL LOW (ref 13.0–17.0)
Lymphs Abs: 2.3 10*3/uL (ref 0.7–4.0)
MCHC: 34 g/dL (ref 30.0–36.0)
MCV: 97.6 fl (ref 78.0–100.0)
Monocytes Absolute: 0.5 10*3/uL (ref 0.1–1.0)
Monocytes Relative: 14.1 % — ABNORMAL HIGH (ref 3.0–12.0)
Neutro Abs: 0.8 10*3/uL — ABNORMAL LOW (ref 1.4–7.7)
Platelets: 120 10*3/uL — ABNORMAL LOW (ref 150.0–400.0)
RDW: 13.8 % (ref 11.5–14.6)

## 2010-07-19 LAB — HEPATIC FUNCTION PANEL
AST: 25 U/L (ref 0–37)
Albumin: 4.1 g/dL (ref 3.5–5.2)
Alkaline Phosphatase: 43 U/L (ref 39–117)
Bilirubin, Direct: 0.1 mg/dL (ref 0.0–0.3)
Total Bilirubin: 0.6 mg/dL (ref 0.3–1.2)

## 2010-07-19 LAB — BASIC METABOLIC PANEL
BUN: 19 mg/dL (ref 6–23)
Calcium: 9.2 mg/dL (ref 8.4–10.5)
Creatinine, Ser: 0.9 mg/dL (ref 0.4–1.5)
GFR: 83.76 mL/min (ref >60.00)
Glucose, Bld: 86 mg/dL (ref 70–99)
Potassium: 4.5 mEq/L (ref 3.5–5.1)

## 2010-07-19 LAB — VITAMIN B12: Vitamin B-12: 316 pg/mL (ref 211–911)

## 2010-07-19 LAB — LIPID PANEL
Cholesterol: 145 mg/dL (ref 0–200)
VLDL: 14.2 mg/dL (ref 0.0–40.0)

## 2010-07-19 LAB — TSH: TSH: 1.11 u[IU]/mL (ref 0.35–5.50)

## 2010-07-20 NOTE — Progress Notes (Signed)
  Subjective:    Patient ID: Jordan Little, male    DOB: [birth year], 77 y.o.   MRN: [id number]  HPI He resides in a nursing home  facility. He does have some increased risk of fall due to gait instability & uses a  cane. He is supervised closely by the nursing facility staff.Visionwas assessed by the nurse; please see vital signs. This was also addressed in the review of systems & on the exam itself.    Review of Systems     Objective:   Physical Exam        Assessment & Plan:

## 2010-07-20 NOTE — Progress Notes (Signed)
  Subjective:    Patient ID: Jordan Little, male    DOB: [birth year], 77 y.o.   MRN: [id number]  HPI    Review of Systems he denies any significant ophthalmologic symptoms or signs.     Objective:   Physical Exam  vision is grossly normal to wall chart        Assessment & Plan:

## 2010-08-15 NOTE — Discharge Summary (Signed)
NAMEANTHON, HARPOLE NO.:  [id number]   MEDICAL RECORD NO.:  [id number]          PATIENT TYPE:  INP   LOCATION:  2032                         FACILITY:  [facility]   PHYSICIAN:  Nanetta Batty, M.D.   DATE OF BIRTH:  02/17/1916   DATE OF ADMISSION:  05/01/2007  DATE OF DISCHARGE:  05/03/2007                               DISCHARGE SUMMARY   DISCHARGE DIAGNOSES:  1. Transient ischemic attack.  2. Hypertension, controlled.  3. Coronary disease, stable.  4. Dyslipidemia.  5. Mild peripheral arterial disease with carotid disease.   DISCHARGE CONDITION:  Improved.   PROCEDURES:  None.   DISCHARGE MEDICATIONS:  1. Aspirin 325 mg daily.  2. We added Plavix 75 mg daily.  3. Oxybutynin ER 5 mg daily.  4. Imdur 60 mg daily.  5. Altace 10 mg daily in the morning and at bedtime, we increased the      dose.  6. Toprol-XL 75 mg daily.  7. Lipitor 10 mg daily.  8. Prilosec 20 mg daily.   Please note, he was to stop the doxazosin 4 mg daily.   DISCHARGE INSTRUCTIONS:  1. Low sodium, heart healthy diet.  2. Activity, increase slowly.  3. Follow up with Dr. Allyson Sabal in 1-2 weeks, office will call with date      and time.  4. Follow up with Dr. Alwyn Ren in 1-2 weeks, call his office for an      appointment.   HISTORY OF PRESENT ILLNESS:  Patient presented to the emergency room  May 01, 2007, an 77 year old white married male with a history of  coronary disease secondary to transient blurred vision in his left eye  while going to cardiac rehab.  Symptoms lasted about 15 minutes.  He  denied associated nausea, chest pain or palpitations.  He did state he  may have actually had a little blacking out episode as well for a second  because he did find himself in the wrong lane on [address].  He told  the story to cardiac rehab who called [place] in the hospital to see the  patient.  He was admitted at that point for neuro evaluation.   PAST MEDICAL HISTORY:  1. Hypertension.  2. Dyslipidemia.  3. Prostate cancer status post TURP in 1984.  4. Had a visual implant.  5. He does have a history of coronary disease with intervention to the      RCA in 1997 and a re-do RCA and LAD in 1999, last Myoview was in      2005, low risk of ischemia and normal LV function.   ALLERGIES:  IODINE CAUSES SWELLING.   FAMILY HISTORY/SOCIAL HISTORY/REVIEW OF SYSTEMS:  See H&P.   PHYSICAL EXAM AT DISCHARGE:  Blood pressure 144/80, pulse 58,  respirations 20, afebrile, 93% oxygen saturation room air.  LUNGS:  Clear.  ABDOMEN: Soft, nontender, positive bowel sounds.  HEART:  Regular rate and rhythm.  EXTREMITIES:  Without edema.   LABORATORY DATA:  Hemoglobin 11.2, hematocrit 32.4, WBC 4.5, platelets  were 96,000, on admission he was 109,000.   Chemistry:  Sodium 141,  potassium 3.3, chloride 111, CO2 25, BUN 18,  creatinine 1.13, glucose 91.  Coags were normal, LFTs were normal.  Heart: CK 95, MB 2, troponin I 0.01, negative MI, calcium 8.7.   Miscellaneous:  TSH 1.127 and urinalysis was positive for urinary tract  infection.   Chest x-ray:  Stable chest, no active disease.  Patient had an MRI and  MRA of his head.  MR revealed somewhat limited exam in the origin and  great vessels cannot be adequately assessed because of timing bolus, no  evidence of hemodynamically significant stenosis involving either  carotid bifurcation, left vertebral artery was dominant, mild narrowing  of the proximal right vertebral artery, mild kink and narrowing of the  proximal left vertebral artery, mild narrowing of portions of both  vertebral arteries at the level of C1-2 vertebra, mild narrowing of the  distal right vertebral artery and slight ectasia in the distal left  vertebral artery.   On the angiography, mild intracranial atherosclerotic type changes.  An  MRI without contrast of the brain small vessel disease type changes, no  acute infarction.   HOSPITAL COURSE:  Patient was  admitted due to possible blacking out  episode and visual disturbance, rule out TIA.  Neuro consult was  obtained.  MRI and MRA without acute process.  He was followed up with  Neuro on the 31st and felt stable and only noncritical carotid narrowing  and kinking.  He was felt ready to be discharged home.  He was very anxious about his wife, who he takes care of at home as  well, and wanted to be discharged as soon as possible.  He was  discharged.  Please note, we will follow up on his potassium on Monday  as well as his urinalysis, will repeat that as an outpatient as well.      Darcella Gasman. Annie Paras, N.P.      Nanetta Batty, M.D.  Electronically Signed    LRI/MEDQ  D:  05/03/2007  T:  05/04/2007  Job:  [id number]   cc:   Nanetta Batty, M.D.  Dr. Alwyn Ren  Neurology Service

## 2010-08-15 NOTE — Consult Note (Signed)
Jordan Little, Jordan Little NO.:  [id number]   MEDICAL RECORD NO.:  [id number]          PATIENT TYPE:  INP   LOCATION:  2032                         FACILITY:  [facility]   PHYSICIAN:  Marolyn Hammock. Reynolds, M.D.DATE OF BIRTH:  03/11/1916   DATE OF CONSULTATION:  05/02/2007  DATE OF DISCHARGE:                                 CONSULTATION   REQUESTING PHYSICIAN:  Dr. Nanetta Batty   REASON FOR EVALUATION:  Suspected TIA.   HISTORY OF PRESENT ILLNESS:  This is the initial inpatient consultation  evaluation of this 77 year old man with a past history which includes  hypertension, dyslipidemia, coronary disease with stent placement, and a  history of prostate cancer.  The patient has been attending cardiac  rehab here at [facility].  He states that he was driving  down the road yesterday morning when he experienced sudden onset of  blurring of vision out of his left eye.  He thinks that this was his  left eye as opposed to his left visual field, things even ahead of him  seemed a little bit blurry out of his left eye; however, he did not  check his vision independently out of each eye at this time.  He did not  have any associated headache or any other symptoms of which he was  aware.  He says that by the time he got to the hospital, his visual  symptoms had resolved but he felt a little bit dizzy, by which he  means lightheaded as opposed to vertiginous or unsteady on his feet.  He  thinks that he was walking normally when he came into the hospital.  He  mentioned these symptoms to his therapist who was doing cardiac rehab  with him, and she advised him to be admitted for evaluation.  He was  admitted to the [facility] Cardiology service, has had a workup  including CT of the head and MRI of the brain which was normal, carotid  Doppler study which was normal, 2-D echocardiogram which is not  suggestive of source of embolus.  Neurologic consultation is  requested.  The patient says that since he has been in the hospital he has felt  fine.  He states that he may have had a very similar symptom about a  year ago, but remembers it only very vaguely.  Other than that, he is  not aware of having any previous stroke events.   PAST MEDICAL HISTORY:  He has hypertension for which he is on  medications.  He is on Lipitor for dyslipidemia.  He has had a TURP for  his prostate cancer.   FAMILY/SOCIAL/REVIEW OF SYSTEMS:  Per admission H&P by with Corine Shelter,  P.A., on April 30, 2006, which is reviewed.  Also see the accompanying  resident note.   MEDICATIONS:  His home list of medications includes aspirin, oxybutynin,  Imdur, Toprol, Altace, Lipitor, doxazosin, Prilosec.  In the hospital he  remains on the same medications and has also been placed on Plavix.   PHYSICAL EXAMINATION:  VITAL SIGNS:  Temperature 97.0, blood pressure  178/84,  pulse 62, respirations 20, O2 saturation 93% on room air.  GENERAL EXAMINATION:  This is a healthy-appearing man seated in no  evidence distress.  HEAD:  Cranium is normocephalic and atraumatic.  Oropharynx benign.  NECK:  Supple without carotid or supraclavicular bruits.  HEART:  Regular rhythm without murmurs.  NEUROLOGIC EXAMINATION:  Mental status:  He is awake and alert.  He is  fully oriented to time, place and person.  Recent and remote memory are  intact.  Attention span, concentration and fund of knowledge are all  appropriate.  Speech is fluent and not dysarthric.  Cranial nerves:  Pupils are equal and reactive.  Extraocular movements are full without  nystagmus.  Visual fields are full to confrontation.  Hearing is intact  to conversational speech.  Face, tongue and palate move normally and  symmetrically.  Motor:  Normal bulk and tone.  Normal strength of all  tested extremity muscles.  Sensation:  Intact to light touch throughout.  Coordination:  Rapid movements are performed accurately.   Finger-to-nose  is performed accurately and there is no past-pointing.  Gait:  He rises  easily from a chair.  His stance is normal.  He walks a little bit of a  hunched gait and some short steps, but I think his gait is pretty normal  for his age and condition.  He is able to perform a Romberg maneuver and  a tandem stand without falling.  Reflexes symmetric.  Toes are downgoing  bilaterally.   LABORATORY REVIEW:  CBC remarkable for mild anemia and mild  thrombocytopenia.  BMET remarkable for an elevated glucose of 137.  LFTs  are normal.  MRI of the brain demonstrates no acute infarcts.  Carotid  Doppler studies demonstrate no significant stenosis.  A 2-D  echocardiogram demonstrates a good EF with no focal wall motion  abnormalities and no source of embolus.   IMPRESSION:  Transient left visual symptoms, most likely a manifestation  of amaurosis fugax.  He now has no neurologic findings.  There is no  imaging evidence of a stroke and his carotid Doppler study is negative.   RECOMMENDATIONS:  Would check an MRA of the head and neck to evaluate  both the carotid and vertebral arteries.  If there are no critical  stenoses, would only suggest continuing ongoing risk factor control and  consider changing the aspirin to Plavix monotherapy.  I think he can be  discharged tonight or tomorrow if his MRA looks benign.   Thank you for the consultation.      [name] L. Thad Ranger, M.D.  Electronically Signed     MLR/MEDQ  D:  05/02/2007  T:  05/03/2007  Job:  [id number]   cc:   Titus Dubin. Alwyn Ren, MD,FACP,FCCP

## 2010-08-15 NOTE — H&P (Signed)
NAMEANTIONIO, NEGRON NO.:  [id number]   MEDICAL RECORD NO.:  [id number]          PATIENT TYPE:  INP   LOCATION:  2032                         FACILITY:  [facility]   PHYSICIAN:  Abelino Derrick, P.A.   DATE OF BIRTH:  May 16, 1915   DATE OF ADMISSION:  05/01/2007  DATE OF DISCHARGE:                              HISTORY & PHYSICAL   CHIEF COMPLAINT:  Transient visual loss in his left eye.   HISTORY OF PRESENT ILLNESS:  Mr. Quinney is a pleasant 77 year old male  with a history of coronary disease.  He had an RCA intervention in 1997  and an RCA and LAD intervention in 1999.  His last Myoview was July of  2005 and was low-risk with normal LV function.   While driving to cardiac rehab, this morning, he developed transient  blurred vision in his left eye.  Symptoms lasted for about 15 minutes.  He denies any associated nausea, vomiting, chest pain, or palpitations.  Today, he now admits that he may have blacked out for a couple  seconds.  He found himself [place] the wrong land on [address].  He went on  to cardiac rehab and we were called by the rehab nurse to come see the  patient for dizziness and hypertension.   PAST MEDICAL HISTORY REMARKABLE FOR:  1. Hypertension.  2. Treated dyslipidemia, and prostate cancer.  He has been followed by      Dr. Jethro Bolus.  He had a TUR remotely, and has a BioDur      implant in his left arm.   CURRENT MEDICATIONS:  1. Oxybutynin the ER 5 mg a day.  2. Imdur 60 mg a day.  3. Toprol XL 75 mg a day.  4. Altace 10 mg at h.s.  5. Lipitor 10 mg a day.  6. Doxazosin 4 mg a day.  7. Prilosec daily.  8. Ditropan 5 mg a day.  9. Aspirin 325 mg a day.   ALLERGIES:  He is allergic to IODINE which caused some swelling in the  past.   SOCIAL HISTORY:  He is married.  He has three sons, only one lives  nearby at [facility].  Unfortunately his wife has been diagnosed with  brain cancer.  She is just undergoing chemotherapy and  lives at home.  The patient is a nonsmoker.   FAMILY HISTORY:  The patient's father died at 45 of cancer, and mother  died in her 59s of cancer.   REVIEW OF SYSTEMS:  The patient had carotid Dopplers in 2007 that showed  a less than 49% bilateral internal carotid narrowing.  There is no  history of thyroid disease or diabetes.  He denies any history of GI  bleeding or melena.  He has not had recent anginal symptoms.   PHYSICAL EXAM:  VITAL SIGNS:  Blood pressure 148/78, pulse 60,  respirations 12.  GENERAL:  He is well-developed, well-nourished male in no acute  distress.  HEENT: Normocephalic, atraumatic.  Extraocular movements are intact.  Sclerae nonicteric.  Pupils are equal.  NECK:  Without JVD or bruit.  Thyroid is not enlarged.  CHEST:  Clear to auscultation percussion.  CARDIAC EXAM:  Reveals a regular rate and rhythm without murmur, rub, or  gallop.  Normal S1-S2.  ABDOMEN:  Nontender.  Bowel sounds are present.  EXTREMITIES:  Without edema.  Distal pulses are 3+/4 bilaterally.  NEUROLOGIC EXAM:  Grossly intact.  He moves all extremities without  obvious deficits.  Strength is equal bilaterally.  There is no obvious  facial droop.   IMPRESSION:  1. TIA.  2. Coronary disease, currently stable, as described above.  3. Normal LV function.  4. Treated hypertension.  5. Treated dyslipidemia.  His LDL was 63 in July 2008.  6. Mild peripheral vascular disease by carotid Doppler in 2007.  7. History of prostate cancer, followed by Dr. Patsi Sears.   PLAN:  The patient was seen by Dr. Allyson Sabal and myself today, and is  admitted to telemetry.  Dr. Allyson Sabal would like to get an MRI of his brain.  We will go and get a neuro consult.  We added Plavix, and will monitor  him on telemetry.  If his heart rate drops much lower, we will need to  back off on his Toprol.  We will also get an echocardiogram.      Abelino Derrick, P.ALenard Lance  D:  05/01/2007  T:  05/01/2007  Job:   [id number]

## 2010-08-15 NOTE — Discharge Summary (Signed)
NAMEHANLEY, WOERNER               ACCOUNT NO.:  [id number]   MEDICAL RECORD NO.:  [id number]          PATIENT TYPE:  INP   LOCATION:  5150                         FACILITY:  [facility]   PHYSICIAN:  Bruce Rexene Edison. Swords, MD    DATE OF BIRTH:  1915-04-14   DATE OF ADMISSION:  07/05/2008  DATE OF DISCHARGE:  07/08/2008                               DISCHARGE SUMMARY   DISCHARGE DIAGNOSES:  1. Diverticulitis.  2. History of vitamin B deficiency with anemia.  3. History of pancytopenia.  4. Hypertension.  5. Hyperlipidemia.  6. Coronary artery disease.  7. History of prostate cancer.   DISCHARGE MEDICATIONS IN NURSING HOME ORDERS:  1. Aspirin 325 mg p.o. daily.  2. Imdur 60 mg p.o. daily.  3. Doxazosin 40 mg p.o. q.h.s.  4. Ramipril 10 mg p.o. daily.  5. Metoprolol 75 mg p.o. daily.  6. Oxybutynin XL 5 mg p.o. daily.  7. Simvastatin 20 mg p.o. daily.  8. Augmentin 875 mg p.o. b.i.d. to be discontinued on July 12, 2008.   NURSING HOME ORDERS:  The patient to participate in physical therapy  daily.   DIET:  Low-fat diet.   DISCHARGE PHYSICAL EXAMINATION:  VITAL SIGNS:  Stable, afebrile.  GENERAL:  Elderly male in no acute distress.  CHEST:  Clear to auscultation without increased work of breathing.  CARDIAC:  S1-S2 are regular without gallops.  ABDOMEN:  Active bowel sounds, soft and nontender.  There is no  hepatosplenomegaly.  EXTREMITIES:  There is no clubbing, cyanosis or edema.  NEUROLOGIC:  He is alert and oriented.  He moves all 4 extremities.  He  is able to ambulate with assistance.   CONDITION ON DISCHARGE:  Improved.   FOLLOW UP PLANS:  1. With nursing home physician.  2. He is also to see Dr. Alwyn Ren in 1-2 weeks.   HOSPITAL PROCEDURES:  1. Abdominal x-ray July 06, 2008 demonstrated no free air, no acute      process noted.  2. CT of the abdomen and pelvis demonstrated acute pathology in the      right lower lobe likely representing right-sided diverticulitis.  There are pericolic inflammatory changes, some enlargement of renal      cysts, largest cyst measures approximately 10 cm.  3. CT of pelvis, sigmoid diverticulosis noted, right inguinal hernia      noted.  No inflammation of the appendix   HOSPITAL LABORATORIES:  Liver function tests on July 06, 2008 were  normal except for an alkaline phosphatase of  35, total protein 5.2,  albumin 2.9.  BMET on July 06, 2008 demonstrated glucose of 105.  CBC on  July 07, 2008 was normal except for hemoglobin 11.1, platelet count  87,000.  (White count 9.1).   CONSULTATIONS:  None.   HOSPITAL COURSE:  1. The patient admitted to the hospital on July 05, 2008, see      admission note for details.  The patient presented with abdominal      pain which was quite intense.  Pain was in the right lower quadrant      and somewhat diffusely.  Imaging studies as above.  The patient was      treated aggressively with IV antibiotics.  The patient slowly      improved.  He was essentially pain free on July 08, 2008.  He was      tolerating a diet without difficulty.  Bowel movements were normal.  2. Gait abnormality.  The patient is 77 years old and has been      somewhat unsteady on his feet.  He was seen by physical therapy.      They recommended short-term skilled nursing facility for rehab.      The patient will be transferred to [facility] as he was living      there independently prior to admission.   Greater than 30 minutes spent on discharge planning.      Bruce Rexene Edison Swords, MD  Electronically Signed     BHS/MEDQ  D:  07/08/2008  T:  07/08/2008  Job:  [id number]

## 2010-08-18 NOTE — H&P (Signed)
Jordan Little, Jordan Little NO.:  [id number]   MEDICAL RECORD NO.:  [id number]          PATIENT TYPE:  EMS   LOCATION:  ED                           FACILITY:  [facility]   PHYSICIAN:  Georgina Quint. Plotnikov, MDDATE OF BIRTH:  04-17-1915   DATE OF ADMISSION:  02/24/2006  DATE OF DISCHARGE:                              HISTORY & PHYSICAL   CHIEF COMPLAINT:  Left left flank pain weakness.   HISTORY OF PRESENT ILLNESS:  The patient is a an 77 year old male, who  woke up with severe pain in the left flank that took him to [facility]  Emergency Room.  The pain has improved however, he was not able to  ambulate due to weakness that happened after the administration of IV  pain killers.  He also started to develop the left flank pain back.  He  spent over 8 hours in the emergency room and it was decided to admit  him.   PAST MEDICAL HISTORY:  1. Coronary disease.  2. Dyslipidemia.  3. Kidney stone.  4. Prostate cancer.   CURRENT MEDICINES:  1. Vitamins E and D.  2. Toprol-XL 75 mg daily.  3. Actonel 35 mg daily.  4. Altace 10 mg daily.  5. Cardura 4 mg daily.  6. Diazepam 5 mg daily.  7. Ecotrin daily.  8. Lipitor 10 mg daily.  9. Vitamin C.   ALLERGIES:  IODINE.   SOCIAL HISTORY:  He is a nonsmoker, married.   FAMILY HISTORY:  Positive for coronary artery disease.   REVIEW OF SYSTEMS:  No chest pain or shortness of breath, no syncope,  weakness and shaking earlier today when he was trying to ambulate, did  not pass out.  Had bowel movement earlier in the morning.  No vomiting,  the rest of the 10-point review of system is negative or as above.   PHYSICAL:  VITAL SIGNS:  Temp 96.8, blood pressure 173/82 originally,  later 145/74, heart rate 65 to 52, respirations 20, O2 sat 95% on room  air.  GENERAL:  He is in no acute distress.  HEENT:  Moist mucosa.  NECK:  Supple, no thyromegaly or bruit.  LUNGS:  Clear, no wheezes or rales.  HEART:  S1, S2, no murmur, no  gallop.  ABDOMEN:  Soft, nontender, no organomegaly, no masses felt, left costal  vetebral tenderness present, no rebound symptoms, no masses.  RECTAL:  Not done.  EXTREMITIES:  Lower extremities without edema, calves nontender.  NEUROLOGIC:  He is alert, oriented, cooperative.  SKIN:  Clear with aging changes, no rashes.   LABORATORY DATA:  EKG with normal sinus rhythm with PAC's  .  A  urinanalysis is negative.  White count 5.1, hemoglobin 12.3, sodium 138,  potassium 3.7, creatinine 1.05, calcium 8.6.  CT scan shows a 5 mm stone  at UV junction, no hydronephrosis, multiple small stones in the kidneys.   ASSESSMENT AND PLAN:  1. Left renal colic, start p.r.n. IV morphine and Toradol; hopefully,      he will pass the stone on his own.  2. Weakness, unable to walk,  this is partially related to narcotic      pain killers.  We will admit to medical service.  He will ambulate      with assistance.  We will watch.  3. Abdominal distention without pain.  We will watch.  4. Coronary artery disease.  We will continue with current medicines.  5. Hypertension, better control now.  6. Deep vein thrombosis prophylaxis.      Georgina Quint. Plotnikov, MD  Electronically Signed     AVP/MEDQ  D:  02/24/2006  T:  02/24/2006  Job:  [id number]

## 2010-08-18 NOTE — Op Note (Signed)
NAMEKENTAVIOUS, MICHELE               ACCOUNT NO.:  [id number]   MEDICAL RECORD NO.:  [id number]          PATIENT TYPE:  AMB   LOCATION:  [facility]                         FACILITY:  [facility]   PHYSICIAN:  Georges Lynch. Gioffre, M.D.DATE OF BIRTH:  [birth year]   DATE OF PROCEDURE:  05/08/2004  DATE OF DISCHARGE:                                 OPERATIVE REPORT   PREOPERATIVE DIAGNOSIS:  Severe carpal tunnel syndrome on the right.   POSTOPERATIVE DIAGNOSIS:  Severe carpal tunnel syndrome on the right.   OPERATION:  Right carpal tunnel release.   SURGEON:  Georges Lynch. Darrelyn Hillock, M.D.   ASSISTANT:  Nurse.   PROCEDURE:  Under IV regional anesthesia, routine orthopedic prep and  draping with Hibiclens was carried out since he was allergic to IODINE.  A  lazy S incision was made over the flexor aspect of the right wrist, bleeders  identified and cauterized with the bipolar.  Following this, I identified  the superficial and deep transverse carpal ligaments.  I protected the  median nerve and released the ligaments in usual fashion down into the palm  and also proximally to the nerve, and that was free.  His canal was very  tight starting out.  I thoroughly irrigated out the area and then closed the  skin only with 3-0 nylon suture, sterile Neosporin bundle dressings applied.  The patient left the operating room in satisfactory condition.      RAG/MEDQ  D:  05/08/2004  T:  05/08/2004  Job:  [id number]

## 2010-08-18 NOTE — Discharge Summary (Signed)
NAMEWYMON, SWANEY               ACCOUNT NO.:  [id number]   MEDICAL RECORD NO.:  [id number]          PATIENT TYPE:  AMB   LOCATION:  [facility]                         FACILITY:  [facility]   PHYSICIAN:  Valerie A. Felicity Coyer, MDDATE OF BIRTH:  12-01-1915   DATE OF ADMISSION:  DATE OF DISCHARGE:                               DISCHARGE SUMMARY   DISCHARGE DIAGNOSES:  1. A 5-mm left ureterovesical junction stone with left ureter      dilation.  2. Left lower lobe pneumonia.   HISTORY OF PRESENT ILLNESS:  Mr. Keys is an 77 year old white male who  was admitted on February 24, 2006, with chief complaint of left flank  pain and weakness.  He underwent a CT of the abdomen, which noted mild  to moderate left hydroureteral nephrosis and multiple bilateral punctate  nonobstructing stones as well as multiple bilateral renal cysts.  Scattered colonic diverticula were also noted.  He was admitted for  further evaluation and treatment.   PAST MEDICAL HISTORY:  1. Coronary artery disease.  2. Dyslipidemia.  3. History of kidney stones.  4. History of prostate cancer.   COURSE OF HOSPITALIZATION:  1. Left UVJ stone.  Patient was admitted.  A urinalysis was performed,      which was clean.  His white blood cell count was noted to be      normal.  A urology consult was obtained and the patient was      evaluated by Dr. Patsi Sears.  Per Dr. Imelda Pillow note he is      scheduled for outpatient cystoscopy on Friday at 3:30 PM.  It is      recommended that he be continued on Cardura to help pass his lower      ureteral stone.  He will be given a prescription for Vicodin as      needed for pain; however, is instructed to call Dr. Patsi Sears      should he develop worsening pain.  2. Left lower lobe pneumonia.  The patient underwent a chest x-ray on      admission, which showed some vascular crowding and a questionable      left lower lobe pneumonia.  As he reports having been extremely      weak  recently, he will be treated for a total of 7 days empirically      for a left lower lobe pneumonia.  A PT eval was requested during      this admission due to patient weakness and it was felt that he had      no PT or OT needs.   DISCHARGE MEDICATIONS:  1. Dulcolax 1 to 2 tabs daily as needed for constipation.  2. Cardura 4 mg p.o. daily.  3. Aspirin 81 mg p.o. daily.  4. Lipitor 10 mg p.o. daily.  5. Altace 10 mg p.o. daily.  6. Toprol-XL 75 mg p.o. daily.  7. Ditropan 5 mg p.o. daily.  8. Imdur 60 mg p.o. daily.  9. Avelox 400 mg p.o. daily for 5 days more.   PERTINENT LABORATORY DATA:  Hemoglobin 10.6, hematocrit 30.3, BUN  10,  creatinine 0.8.  BNP 207.   DISPOSITION:  Patient will be discharged home.  He is to follow up with  Dr. Patsi Sears on Friday as scheduled per Dr. Patsi Sears.  He is  instructed to follow up with Dr. Alwyn Ren on December 06 at 10:15 a.m.  He  is to call Dr. Alwyn Ren if he is to develop a fever over 101 or increase  in weakness and call Dr. Patsi Sears should he develop difficulty  urinating, blood in the urine, or increasing pain.      Sandford Craze, NP      Raenette Rover. Felicity Coyer, MD  Electronically Signed    MO/MEDQ  D:  02/27/2006  T:  02/27/2006  Job:  [id number]   cc:   Lynelle Smoke I. Patsi Sears, M.D.  Titus Dubin. Alwyn Ren, MD,FACP,FCCP

## 2010-08-18 NOTE — Op Note (Signed)
NAME:  Jordan Little, Jordan Little                         ACCOUNT NO.:  [id number]   MEDICAL RECORD NO.:  [id number]                   PATIENT TYPE:  AMB   LOCATION:  DAY                                  FACILITY:  [facility]   PHYSICIAN:  Georges Lynch. Gioffre, M.D.             DATE OF BIRTH:  [birth year]   DATE OF PROCEDURE:  11/10/2003  DATE OF DISCHARGE:                                 OPERATIVE REPORT   PREOPERATIVE DIAGNOSIS:  Complete retracted tear of the rotator cuff tendon  on the right.   POSTOPERATIVE DIAGNOSIS:  Complete retracted tear of the rotator cuff tendon  on the right.   OPERATION:  1. Partial acromionectomy and acromioplasty on the right.  2. Tissue Mend graft to the right rotator cuff with two Multitak sutures.   ASSISTANT:  Ebbie Ridge. Paitsel, P.A.   PROCEDURE:  Under general anesthesia, prior to general anesthesia first had  an interscalene block on the right.  He was given 1 g of IV Ancef.  At this  time after sterile prep and draping was carried out, incision was made over  the anterior aspect of the right shoulder and bleeders were identified and  cauterized.  Following that, the incision was carried deep down to the  deltoid tendon.  The deltoid tendon was dissected free from the acromion by  sharp dissection.  I then carried out a partial acromionectomy and  acromioplasty because of the severe impingement syndrome.  The remaining  rotator cuff tendon was protected at that time.  Following this I then  excised the subdeltoid bursa.  I then identified the severely torn,  retracted tendon.  It was split more longitudinally.  I then utilized a bur  to bur down the lateral articular surface of the humerus.  I then sutured by  primary suture with #1 Ethibond suture the proximal tear portion that was  torn of the rotator cuff.  I came down to about 1.5 cm distally and then  utilized two Multitak sutures.  I inserted those into the proximal humerus  and then repaired the remaining  defect with a Tissue Mend graft.  I  thoroughly irrigated out the area.  Good hemostasis was maintained.  I did  utilized thrombin-soaked Gelfoam as well.  The deltoid muscle then was  brought up through the acromion and sutured to the remaining acromion with  #1 Ethibond suture.  The remaining deltoid was closed with 0 Vicryl, subcu  was closed with 0 Vicryl, skin with metal staples.  Sterile Neosporin  dressing was applied, and he was placed in a shoulder immobilizer.                                               Ronald A. Darrelyn Hillock, M.D.    RAG/MEDQ  D:  11/10/2003  T:  11/10/2003  Job:  [id number]   cc:   Nanetta Batty, M.D.  Fax: [phone]

## 2010-08-18 NOTE — Op Note (Signed)
NAMEDEMETRIC, Jordan Little               ACCOUNT NO.:  [id number]   MEDICAL RECORD NO.:  [id number]          PATIENT TYPE:  AMB   LOCATION:  [facility]                         FACILITY:  [facility]   PHYSICIAN:  Sigmund I. Patsi Sears, M.D.DATE OF BIRTH:  04/24/1915   DATE OF PROCEDURE:  03/01/2006  DATE OF DISCHARGE:                               OPERATIVE REPORT   PREOPERATIVE DIAGNOSIS:  Impacted left ureterovesical junction stone.   POSTOPERATIVE DIAGNOSIS:  Impacted left ureterovesical junction stone.   OPERATION:  Cystourethroscopy, left retrograde pyelogram with  interpretation, left ureteroscopy, laser fragmentation of impacted left  ureteral calculus, basket extraction of stone fragments, and left double-  J catheter (6 x 26 cm double-J catheter).   PREPARATION:  After appropriate preanesthesia, the patient is brought to  the operating room and placed on the operating room table in a dorsal  supine with position where general LMA anesthesia was induced.  He was  then replaced in the dorsal lithotomy position where the pubis was  prepped with Betadine solution and draped in the usual fashion.   REVIEW OF HISTORY:  This 77 year old male was admitted to [facility] one week ago with pyelonephritis and was found to have a left  lower ureteral calculus, measuring 5 mm, with hydronephrosis.  The  patient was treated with antibiotics.  He was noted to have pneumonia,  this was also treated with antibiotics.  He has recovered from that and  is now for basket extraction of stone.  He has had recurrent ureteral  colic overnight.   PROCEDURE:  After prepping and draped in usual fashion (the patient is  allergic to iodine), cystoscopy was accomplished and shows a  trabeculated bladder but no definite cellule formation.  Left retrograde  pyelogram was performed which showed a 5 mm stone at the left  ureterovesical junction.  A guidewire was passed around the stone into  the renal pelvis.   The 6-French ureteroscope was then passed into the lower ureter and the  stone was identified.  Laser fragmentation was then accomplished, and  following fragmentation, each fragment is basket extracted.  Following  this, it was elected to place a double-J catheter and a 6 by 26 cm  double-J catheter was passed into the renal pelvis and coiled into the  bladder.  The patient tolerated procedure well.  The bladder was drained  of fluid. The patient was given 15 mg of IV Toradol, awakened and taken  to the recovery room in good condition.  He received IV Ancef prior to  the procedure.      Sigmund I. Patsi Sears, M.D.  Electronically Signed     SIT/MEDQ  D:  03/01/2006  T:  03/01/2006  Job:  [id number]

## 2010-09-08 ENCOUNTER — Other Ambulatory Visit: Payer: Self-pay | Admitting: Internal Medicine

## 2010-10-15 ENCOUNTER — Emergency Department (INDEPENDENT_AMBULATORY_CARE_PROVIDER_SITE_OTHER): Payer: Medicare Other

## 2010-10-15 ENCOUNTER — Encounter (HOSPITAL_BASED_OUTPATIENT_CLINIC_OR_DEPARTMENT_OTHER): Payer: Self-pay | Admitting: Emergency Medicine

## 2010-10-15 ENCOUNTER — Emergency Department (HOSPITAL_BASED_OUTPATIENT_CLINIC_OR_DEPARTMENT_OTHER)
Admission: EM | Admit: 2010-10-15 | Discharge: 2010-10-15 | Disposition: A | Discharge status: Skilled Nursing Facility | Payer: Medicare Other | Attending: Emergency Medicine | Admitting: Emergency Medicine

## 2010-10-15 ENCOUNTER — Other Ambulatory Visit: Payer: Self-pay

## 2010-10-15 DIAGNOSIS — R109 Unspecified abdominal pain: Secondary | ICD-10-CM

## 2010-10-15 DIAGNOSIS — G319 Degenerative disease of nervous system, unspecified: Secondary | ICD-10-CM

## 2010-10-15 DIAGNOSIS — I679 Cerebrovascular disease, unspecified: Secondary | ICD-10-CM

## 2010-10-15 DIAGNOSIS — K5732 Diverticulitis of large intestine without perforation or abscess without bleeding: Secondary | ICD-10-CM

## 2010-10-15 DIAGNOSIS — R5383 Other fatigue: Secondary | ICD-10-CM

## 2010-10-15 DIAGNOSIS — E785 Hyperlipidemia, unspecified: Secondary | ICD-10-CM | POA: Insufficient documentation

## 2010-10-15 DIAGNOSIS — Z9889 Other specified postprocedural states: Secondary | ICD-10-CM | POA: Insufficient documentation

## 2010-10-15 DIAGNOSIS — I10 Essential (primary) hypertension: Secondary | ICD-10-CM | POA: Insufficient documentation

## 2010-10-15 DIAGNOSIS — M4120 Other idiopathic scoliosis, site unspecified: Secondary | ICD-10-CM

## 2010-10-15 DIAGNOSIS — Z8546 Personal history of malignant neoplasm of prostate: Secondary | ICD-10-CM | POA: Insufficient documentation

## 2010-10-15 DIAGNOSIS — K5730 Diverticulosis of large intestine without perforation or abscess without bleeding: Secondary | ICD-10-CM

## 2010-10-15 DIAGNOSIS — I2510 Atherosclerotic heart disease of native coronary artery without angina pectoris: Secondary | ICD-10-CM | POA: Insufficient documentation

## 2010-10-15 DIAGNOSIS — R10819 Abdominal tenderness, unspecified site: Secondary | ICD-10-CM | POA: Insufficient documentation

## 2010-10-15 DIAGNOSIS — Q619 Cystic kidney disease, unspecified: Secondary | ICD-10-CM

## 2010-10-15 DIAGNOSIS — Z79899 Other long term (current) drug therapy: Secondary | ICD-10-CM | POA: Insufficient documentation

## 2010-10-15 LAB — DIFFERENTIAL
Basophils Absolute: 0 10*3/uL (ref 0.0–0.1)
Basophils Relative: 0 % (ref 0–1)
Eosinophils Absolute: 0 10*3/uL (ref 0.0–0.7)
Eosinophils Relative: 0 % (ref 0–5)
Monocytes Absolute: 0.4 10*3/uL (ref 0.1–1.0)

## 2010-10-15 LAB — CARDIAC PANEL(CRET KIN+CKTOT+MB+TROPI)
CK, MB: 2.8 ng/mL (ref 0.3–4.0)
Total CK: 101 U/L (ref 7–232)

## 2010-10-15 LAB — COMPREHENSIVE METABOLIC PANEL
AST: 22 U/L (ref 0–37)
Albumin: 4.1 g/dL (ref 3.5–5.2)
Calcium: 9.6 mg/dL (ref 8.4–10.5)
Creatinine, Ser: 0.9 mg/dL (ref 0.50–1.35)

## 2010-10-15 LAB — URINALYSIS, ROUTINE W REFLEX MICROSCOPIC
Glucose, UA: NEGATIVE mg/dL
Hgb urine dipstick: NEGATIVE
Leukocytes, UA: NEGATIVE
Specific Gravity, Urine: 1.005 (ref 1.005–1.030)
Urobilinogen, UA: 0.2 mg/dL (ref 0.0–1.0)

## 2010-10-15 LAB — CBC
HCT: 35.3 % — ABNORMAL LOW (ref 39.0–52.0)
MCH: 31.7 pg (ref 26.0–34.0)
MCHC: 33.7 g/dL (ref 30.0–36.0)
MCV: 94.1 fL (ref 78.0–100.0)
RDW: 13 % (ref 11.5–15.5)

## 2010-10-15 MED ORDER — METRONIDAZOLE 500 MG PO TABS
ORAL_TABLET | ORAL | Status: AC
  Administered 2010-10-15: 500 mg via ORAL
  Filled 2010-10-15: qty 1

## 2010-10-15 MED ORDER — METRONIDAZOLE 500 MG PO TABS
500.0000 mg | ORAL_TABLET | Freq: Once | ORAL | Status: AC
  Administered 2010-10-15: 500 mg via ORAL

## 2010-10-15 MED ORDER — METRONIDAZOLE 500 MG PO TABS: 500.0000 mg | ORAL_TABLET | Freq: Three times a day (TID) | ORAL | Status: AC

## 2010-10-15 NOTE — ED Notes (Signed)
Pt. In no distress, no reports of pain and no nausea noted.

## 2010-10-15 NOTE — ED Provider Notes (Signed)
History     Chief Complaint  Patient presents with  . Abdominal Pain   Patient is a 77 y.o. male presenting with abdominal pain.  Abdominal Pain The primary symptoms of the illness include abdominal pain.    Past Medical History  Diagnosis Date  . Prostate cancer     [facility], Dr. Patsi Sears  . CAD (coronary artery disease)     Dr Allyson Sabal  . Hyperlipidemia   . Hypertension   . Diverticulosis 2010    with colon bleed  . Personal history of renal calculi 2007  . Pneumonia 2007    Past Surgical History  Procedure Date  . Coronary angioplasty with stent placement 1999    Dr. Allyson Sabal, 4 vessels  . Transurethral resection of prostate     Dr.   Mickel Crow  . Prostate surgery     Dr. Patsi Sears    No family history on file.  History  Substance Use Topics  . Smoking status: Former Games developer  . Smokeless tobacco: Not on file  . Alcohol Use: No      Review of Systems  Gastrointestinal: Positive for abdominal pain.    Physical Exam  BP 166/84  Pulse 56  Temp(Src) 97.9 F (36.6 C) (Oral)  Resp 16  SpO2 98%  Physical Exam  ED Course  Procedures  MDM Care assumed from Dr. Hyacinth Meeker.  Agree with history and physical and plan as above.  Tests okay, will discharge home.      Riley Lam [facility] 10/23/10 [phone]

## 2010-10-15 NOTE — ED Provider Notes (Addendum)
History     Chief Complaint  Patient presents with  . Abdominal Pain   HPI Comments: Patient presents with complaints of not feeling quite right. Of note he states that he was at church and developed some midabdominal tenderness in his belly. He states that this has come and gone in the past but today it lasted longer than usual. This is gradually improved and currently has resolved. He denies dysuria, diarrhea, rectal bleeding, radiation of the pain. He also complains of difficulty tying his shoes and buttoning his shirt this morning. A staff person at the nursing facility in the cafeteria and noticed that he was looking pekid or pale in appearance and had his blood pressure checked. He was noted to be higher than 180 systolic with a pulse of 52. At this time patient denies headache, blurred vision, chest pain, weakness, numbness, shortness of breath, fevers, nausea, vomiting, rashes, swelling. Symptoms were brief and spontaneously resolved. Relatives or family members with the patient note that he appears to be at baseline at this time without any difficulty in his speech or mannerisms.   Patient is a 77 y.o. male presenting with abdominal pain. The history is provided by the patient, a relative and a caregiver.  Abdominal Pain The primary symptoms of the illness include abdominal pain.    Past Medical History  Diagnosis Date  . Prostate cancer     [facility] Implant, Dr. Patsi Sears  . CAD (coronary artery disease)     Dr Allyson Sabal  . Hyperlipidemia   . Hypertension   . Diverticulosis 2010    with colon bleed  . Personal history of renal calculi 2007  . Pneumonia 2007    Past Surgical History  Procedure Date  . Coronary angioplasty with stent placement 1999    Dr. Allyson Sabal, 4 vessels  . Transurethral resection of prostate     Dr.   Mickel Crow  . Prostate surgery     Dr. Patsi Sears    No family history on file.  History  Substance Use Topics  . Smoking status: Former Games developer  .  Smokeless tobacco: Not on file  . Alcohol Use: No      Review of Systems  Gastrointestinal: Positive for abdominal pain.  All other systems reviewed and are negative.    Physical Exam  BP 178/78  Pulse 56  Temp(Src) 97.5 F (36.4 C) (Oral)  Resp 16  SpO2 98%  Physical Exam  Nursing note and vitals reviewed. Constitutional: He appears well-developed and well-nourished. No distress.  HENT:  Head: Normocephalic and atraumatic.  Mouth/Throat: Oropharynx is clear and moist. No oropharyngeal exudate.  Eyes: Conjunctivae and EOM are normal. Pupils are equal, round, and reactive to light. Right eye exhibits no discharge. Left eye exhibits no discharge. No scleral icterus.  Neck: Normal range of motion. Neck supple. No JVD present. No thyromegaly present.  Cardiovascular: Normal rate, regular rhythm, normal heart sounds and intact distal pulses.  Exam reveals no gallop and no friction rub.   No murmur heard. Pulmonary/Chest: Effort normal and breath sounds normal. No respiratory distress. He has no wheezes. He has no rales.  Abdominal: Soft. Bowel sounds are normal. He exhibits no distension and no mass. There is tenderness.       Mild midabdominal tenderness and left lower quadrant tenderness. No peritoneal, no guarding.  Musculoskeletal: Normal range of motion. He exhibits no edema and no tenderness.  Lymphadenopathy:    He has no cervical adenopathy.  Neurological: He is alert. Coordination  normal.  Skin: Skin is warm and dry. No rash noted. He is not diaphoretic. No erythema.  Psychiatric: He has a normal mood and affect. His behavior is normal.    ED Course  Procedures  MDM Patient has a fairly benign exam with mild abdominal tenderness to palpation. He is noted to have some hypertension and mild bradycardia. Overall neurologic exam is intact without weakness, with numbness, slurred speech or visual changes. We'll obtain CT scan of the head some blood work and a CT scan of  his abdomen to rule out anything such as abdominal aneurysm or diverticulitis.   Patient reevaluated, has no complaints at this time.  Blood work shows no acute abnormalities including his white blood cell count, electrolytes, hepatic function, urinalysis. CT scans are pending at this time EKG report below. Change of shift at this time change of care discussed and signed out to Dr. Judd Lien.  ED ECG REPORT   Date: 10/15/2010   Rate: 57   Rhythm: sinus bradycardia  QRS Axis: normal  Intervals: normal  ST/T Wave abnormalities: nonspecific ST/T changes  Conduction Disutrbances:none  Narrative Interpretation: now with non specific ST changes and T wave changes.  Rate unchanged.  No ST elevation.  Old EKG Reviewed: changes noted - 05/19/09  Vida Roller, MD 10/15/10 1636  Vida Roller, MD 10/15/10 [phone]

## 2010-10-15 NOTE — ED Notes (Signed)
IV removed.

## 2010-10-15 NOTE — ED Notes (Signed)
Pt. Has no neuro deficits and is alert and oriented.  Pt. Has equal grips and equal leg strength.

## 2010-10-15 NOTE — ED Notes (Signed)
Unable to obtain temp in triage  

## 2010-10-15 NOTE — ED Notes (Signed)
Pt. Is on second drink of contrast and will be ready for transport at 1650

## 2010-10-20 ENCOUNTER — Ambulatory Visit (INDEPENDENT_AMBULATORY_CARE_PROVIDER_SITE_OTHER): Payer: Medicare Other | Admitting: Internal Medicine

## 2010-10-20 VITALS — BP 130/70 | Temp 98.4°F | Wt 154.2 lb

## 2010-10-20 DIAGNOSIS — I10 Essential (primary) hypertension: Secondary | ICD-10-CM

## 2010-10-20 DIAGNOSIS — K5732 Diverticulitis of large intestine without perforation or abscess without bleeding: Secondary | ICD-10-CM

## 2010-10-20 DIAGNOSIS — K5792 Diverticulitis of intestine, part unspecified, without perforation or abscess without bleeding: Secondary | ICD-10-CM

## 2010-10-20 NOTE — Patient Instructions (Signed)
Stay on clear liquids  Having any pain.This would include  jello, sherbert (NOT ice cream), Lipton's chicken noodle soup(NOT cream based soups),Gatorade Lite, flat Ginger ale (without High Fructose Corn Syrup),dry toast or crackers, baked potato.No milk , dairy or grease until bowels are formed. Align , a [employer] , daily if stools are loose. Immodium AD for frankly watery stool. Report increasing pain, fever or rectal bleeding  Blood Pressure Goal  Ideally is an AVERAGE < 135/85. This AVERAGE should be calculated from @ least 5-7 BP readings taken @ different times of day on different days of week. You should not respond to isolated BP readings , but rather the AVERAGE for that week .  Please call on Monday 7/23 if you have any symptoms. An option would be to continue low-dose metronidazole for 5-7 days if needed.

## 2010-10-20 NOTE — Progress Notes (Signed)
  Subjective:    Patient ID: STETSON PELAEZ, male    DOB: 1915/10/28, 77 y.o.   MRN: [id number]  HPI ABDOMINAL PAIN: Location: diffuse over lower abd  Onset: 7/15   Radiation: no  Severity: up to 10 Quality: sharp  Duration:until treated in ER 7/15; CT scan : Diverticulitis Symptoms @ present: Nausea/Vomiting: no  Diarrhea: no  Constipation: no  But no BM today Melena/BRBPR: no  Hematemesis: no  Anorexia: no  Fever/Chills: no  Dysuria: no  Wt loss: no  NSAIDs/ASA: no  PMH: diverticular bleed   He has not had any pain since early Monday 7/16    Review of Systems BP was up in ER     Objective:   Physical Exam General appearance is one of good health and nourishment w/o distress.  Eyes: No conjunctival inflammation or scleral icterus is present.  Oral exam: Dental hygiene is good; lips and gums are healthy appearing.There is no oropharyngeal erythema or exudate noted.   Heart:  Normal rate and regular rhythm. S1 and S2 normal without gallop, murmur, click, rub.S4   Lungs:Chest clear to auscultation; no wheezes, rhonchi,rales ,or rubs present.No increased work of breathing.   Abdomen: bowel sounds decreased, soft and  Minimally tender R lateral abdomen  without masses, organomegaly or hernias noted.  No guarding or rebound   Skin:Warm & dry.  Intact without suspicious lesions or rashes ; no jaundice or tenting  Lymphatic: No lymphadenopathy is noted about the head, neck, axilla, or inguinal areas.               Assessment & Plan:  #1 abdominal pain probably due to low-grade diverticulitis  #2 hypertensive flare in the setting of #1. Blood pressure now well controlled  Plan: See patient instructions.

## 2010-10-24 ENCOUNTER — Telehealth: Payer: Self-pay | Admitting: *Deleted

## 2010-10-24 MED ORDER — AMLODIPINE BESYLATE 5 MG PO TABS: 5.0000 mg | ORAL_TABLET | Freq: Two times a day (BID) | ORAL | Status: DC

## 2010-10-24 NOTE — Telephone Encounter (Signed)
Discuss with patient updated Rx sent to pharmacy.

## 2010-10-24 NOTE — Telephone Encounter (Signed)
Pt called and states that he took his BP and it was 172/94. This is the first time it has been high since he has been recording. Pt would like Dr.Hoppers advise.

## 2010-10-24 NOTE — Telephone Encounter (Signed)
Increase amlodipine to 5 mg twice a day until blood pressures controlled

## 2010-12-21 LAB — URINALYSIS, ROUTINE W REFLEX MICROSCOPIC
Bilirubin Urine: NEGATIVE
Glucose, UA: NEGATIVE
Hgb urine dipstick: NEGATIVE
Ketones, ur: NEGATIVE
Protein, ur: NEGATIVE
Urobilinogen, UA: 1

## 2010-12-21 LAB — BASIC METABOLIC PANEL
CO2: 25
Calcium: 8.7
GFR calc Af Amer: >60
Glucose, Bld: 91
Potassium: 3.3 — ABNORMAL LOW
Sodium: 141

## 2010-12-21 LAB — DIFFERENTIAL
Basophils Absolute: 0
Basophils Relative: 1
Eosinophils Relative: 3
Lymphocytes Relative: 35
Monocytes Absolute: 0.5
Monocytes Relative: 11
Neutro Abs: 2.3

## 2010-12-21 LAB — COMPREHENSIVE METABOLIC PANEL
Albumin: 3.7
Alkaline Phosphatase: 49
BUN: 11
CO2: 27
Chloride: 108
Creatinine, Ser: 0.75
GFR calc non Af Amer: >60
Glucose, Bld: 137 — ABNORMAL HIGH
Potassium: 4.1
Total Bilirubin: 1.1

## 2010-12-21 LAB — CARDIAC PANEL(CRET KIN+CKTOT+MB+TROPI): Troponin I: 0.01

## 2010-12-21 LAB — CBC
HCT: 32.4 — ABNORMAL LOW
HCT: 35.2 — ABNORMAL LOW
Hemoglobin: 11.2 — ABNORMAL LOW
Hemoglobin: 12.1 — ABNORMAL LOW
MCHC: 34.5
MCV: 98.2
Platelets: 109 — ABNORMAL LOW
RBC: 3.32 — ABNORMAL LOW
RDW: 13
WBC: 5.3

## 2010-12-21 LAB — PROTIME-INR
INR: 1
Prothrombin Time: 13.4

## 2010-12-21 LAB — APTT: aPTT: 37

## 2011-03-16 ENCOUNTER — Other Ambulatory Visit: Payer: Self-pay | Admitting: Internal Medicine

## 2011-04-26 ENCOUNTER — Telehealth: Payer: Self-pay | Admitting: *Deleted

## 2011-04-26 NOTE — Telephone Encounter (Signed)
Advise Dr Warren Danes will contact Dr berry in reference to this med.

## 2011-04-26 NOTE — Telephone Encounter (Signed)
History to be discussed with Dr. Allyson Sabal, his cardiologist as he's had angioplasty and stents.

## 2011-04-26 NOTE — Telephone Encounter (Signed)
Oral surgeon calling and would like ok to stop Pt plavix for 2 days to perform procedure.Please advise

## 2011-05-23 ENCOUNTER — Other Ambulatory Visit: Payer: Self-pay | Admitting: Internal Medicine

## 2011-05-23 NOTE — Telephone Encounter (Signed)
Patient will need a CPX 10/2011

## 2011-06-26 ENCOUNTER — Telehealth: Payer: Self-pay | Admitting: Internal Medicine

## 2011-06-26 NOTE — Telephone Encounter (Signed)
Hopper pt please advise 

## 2011-06-26 NOTE — Telephone Encounter (Signed)
Caller: Liam Graham; PCP: Marga Melnick; CB#: [phone]; ; ; Call regarding Bp Low;  Jenny/RN from [place], where pt lives independantly is calling per pt's request-did not want to call himself. Pt presents today, 3/26 with a BP of 88/52. Earlier in the week pt's BP was 94/58. Pt's gets routine BP checks weekly and systollic usually runs in the 108-116 range. Pt c/o of fatigue but is otherwise asymptomatic. PT told Boneta Lucks he is eating and drinking wnl for pt; denies urinary or bowel chx, abdominal sxs, N/V/D or any neurological changes. Pt walked to clinic from apartment "which is a good ways". Pt does have a hx of a mild GI bleed in the past per RN. Advised Boneta Lucks that pt should be evaluated in office. Appt made for 3/28 at 1100-RN to relay to pt and give strict call back parameters.

## 2011-06-28 ENCOUNTER — Encounter: Payer: Self-pay | Admitting: Internal Medicine

## 2011-06-28 ENCOUNTER — Ambulatory Visit (INDEPENDENT_AMBULATORY_CARE_PROVIDER_SITE_OTHER): Payer: Medicare Other | Admitting: Internal Medicine

## 2011-06-28 VITALS — BP 110/62 | HR 64 | Temp 98.0°F | Wt 148.2 lb

## 2011-06-28 DIAGNOSIS — I10 Essential (primary) hypertension: Secondary | ICD-10-CM

## 2011-06-28 DIAGNOSIS — R1013 Epigastric pain: Secondary | ICD-10-CM

## 2011-06-28 DIAGNOSIS — D61818 Other pancytopenia: Secondary | ICD-10-CM

## 2011-06-28 LAB — BASIC METABOLIC PANEL
CO2: 25 mEq/L (ref 19–32)
Calcium: 9.6 mg/dL (ref 8.4–10.5)
Chloride: 107 mEq/L (ref 96–112)
Glucose, Bld: 97 mg/dL (ref 70–99)
Potassium: 5 mEq/L (ref 3.5–5.1)
Sodium: 140 mEq/L (ref 135–145)

## 2011-06-28 LAB — CBC WITH DIFFERENTIAL/PLATELET
Basophils Absolute: 0 10*3/uL (ref 0.0–0.1)
Eosinophils Absolute: 0.1 10*3/uL (ref 0.0–0.7)
HCT: 36.1 % — ABNORMAL LOW (ref 39.0–52.0)
Hemoglobin: 11.9 g/dL — ABNORMAL LOW (ref 13.0–17.0)
Lymphs Abs: 1.6 10*3/uL (ref 0.7–4.0)
MCHC: 33 g/dL (ref 30.0–36.0)
Monocytes Absolute: 0.5 10*3/uL (ref 0.1–1.0)
Neutro Abs: 3.2 10*3/uL (ref 1.4–7.7)
Platelets: 126 10*3/uL — ABNORMAL LOW (ref 150.0–400.0)
RDW: 13.8 % (ref 11.5–14.6)

## 2011-06-28 LAB — HEPATIC FUNCTION PANEL
Albumin: 4.2 g/dL (ref 3.5–5.2)
Total Bilirubin: 0.5 mg/dL (ref 0.3–1.2)

## 2011-06-28 MED ORDER — RANITIDINE HCL 150 MG PO CAPS: 150.0000 mg | ORAL_CAPSULE | Freq: Two times a day (BID) | ORAL | Status: DC

## 2011-06-28 MED ORDER — AMLODIPINE BESYLATE 5 MG PO TABS: 2.5000 mg | ORAL_TABLET | Freq: Every day | ORAL | Status: DC

## 2011-06-28 NOTE — Patient Instructions (Signed)
Please see me in 2 weeks.  The triggers for dyspepsia or "heart burn"  include stress; the "aspirin family" ; alcohol; peppermint; and caffeine (coffee, tea, cola, and chocolate). The aspirin family would include aspirin and the nonsteroidal agents such as ibuprofen &  Naproxen. Tylenol would not cause reflux. If having dyspepsia ; food & drink should be avoided for @ least 2 hours before going to bed.

## 2011-06-28 NOTE — Progress Notes (Signed)
  Subjective:    Patient ID: Jordan Little, male    DOB: February 13, 1916, 78 y.o.   MRN: [id number]  HPI SYMPTOMATIC  HYPOTENSION in context of HYPERTENSION Onset: few days ago Character:low BP after bouts of chest & abdominal pain Disease Monitoring  Blood pressure range: unsure  Chest pain: epigastric, dull without associated diaphoresis or nausea. This usually occurs at night; it awakens him. It is not exertional and does not radiate  Dyspnea:no   Claudication: pain from waist down  Medication compliance: yes  Medication Side Effects  Lightheadedness: with low BP   Urinary frequency:nocturia  Edema: no  Preventitive Healthcare:  Exercise:no   Diet Pattern:no plan  Salt Restriction: yes                     Review of Systems The dull epigastric pain will last minutes and then resolve. This will be followed by a normal bowel movement with subsequent bowel bowel movements which are loose to watery. He denies any melena or rectal bleeding. He denies significant fatigue;  He has lost 6 #     Objective:   Physical Exam  Gen.: appears frail but adequately nourished in appearance. Alert, appropriate and cooperative throughout exam. Head: Normocephalic without obvious abnormalities  Eyes: No corneal or conjunctival inflammation noted. No icterus. EOMI; no lid lag or proptosis.   Nose: External nasal exam reveals no deformity or inflammation. Nasal mucosa are pink and moist. No lesions or exudates noted.  Mouth: Oral mucosa and oropharynx reveal no lesions or exudates. Teeth in good repair. Neck: No deformities, masses, or tenderness noted.  Thyroid normal Lungs: Normal respiratory effort; chest expands symmetrically. Lungs are clear to auscultation without rales, wheezes, or increased work of breathing. Heart: Normal rate and rhythm. Normal S1 and S2. No gallop, click, or rub. Grade 1-1.5 /6 systolic murmur  Abdomen: Bowel sounds normal; abdomen soft  But slightly tender in epigastrium. No  masses, organomegaly or hernias noted.                                                            Musculoskeletal/extremities: Thoracic scoliosis noted . No clubbing, cyanosis, edema noted. Joints ; OA DIP changes. Nail health  Good.Using cane Vascular: Carotid, radial artery, dorsalis pedis and  posterior tibial pulses are full and equal. No bruits present. Neurologic: Alert and oriented x3. Deep tendon reflexes symmetrical and 1/2+.          Skin: Intact without suspicious lesions or rashes. No jaundice Lymph: No cervical, axillary lymphadenopathy present. Psych: Mood and affect are his normal. Normally interactive                                                                                         Assessment & Plan:  #1 epigastric discomfort  See orders and recommendations

## 2011-06-28 NOTE — Assessment & Plan Note (Addendum)
Blood pressure low in the context of epigastric discomfort as noted in history. Blood pressure medicines will be adjusted. The amlodipine 5 mg twice a day will be decreased to 2.5 mg once daily.  EKG reveals a sinus bradycardia with a rate of 51. He has diffuse low voltage and minor nonspecific ST-T changes without frank ischemia.  His blood pressure and symptoms will be reassessed with these medicine changes. The other blood pressure medicines may need to be adjusted as well in view of bradycardia.

## 2011-07-06 ENCOUNTER — Inpatient Hospital Stay (HOSPITAL_BASED_OUTPATIENT_CLINIC_OR_DEPARTMENT_OTHER)
Admission: EM | Admit: 2011-07-06 | Discharge: 2011-07-09 | DRG: 342 | Disposition: A | Discharge status: Skilled Nursing Facility | Payer: Medicare Other | Attending: General Surgery | Admitting: General Surgery

## 2011-07-06 ENCOUNTER — Encounter (HOSPITAL_BASED_OUTPATIENT_CLINIC_OR_DEPARTMENT_OTHER): Payer: Self-pay | Admitting: *Deleted

## 2011-07-06 ENCOUNTER — Emergency Department (HOSPITAL_COMMUNITY): Payer: Medicare Other | Admitting: Anesthesiology

## 2011-07-06 ENCOUNTER — Encounter (HOSPITAL_COMMUNITY): Admission: EM | Disposition: A | Discharge status: Skilled Nursing Facility | Payer: Self-pay | Source: Home / Self Care

## 2011-07-06 ENCOUNTER — Encounter (HOSPITAL_COMMUNITY): Payer: Self-pay | Admitting: Anesthesiology

## 2011-07-06 ENCOUNTER — Emergency Department (INDEPENDENT_AMBULATORY_CARE_PROVIDER_SITE_OTHER): Payer: Medicare Other

## 2011-07-06 DIAGNOSIS — M25551 Pain in right hip: Secondary | ICD-10-CM

## 2011-07-06 DIAGNOSIS — K3580 Unspecified acute appendicitis: Principal | ICD-10-CM | POA: Diagnosis present

## 2011-07-06 DIAGNOSIS — K219 Gastro-esophageal reflux disease without esophagitis: Secondary | ICD-10-CM | POA: Diagnosis present

## 2011-07-06 DIAGNOSIS — K4030 Unilateral inguinal hernia, with obstruction, without gangrene, not specified as recurrent: Secondary | ICD-10-CM | POA: Diagnosis present

## 2011-07-06 DIAGNOSIS — Z9861 Coronary angioplasty status: Secondary | ICD-10-CM

## 2011-07-06 DIAGNOSIS — I10 Essential (primary) hypertension: Secondary | ICD-10-CM | POA: Diagnosis present

## 2011-07-06 DIAGNOSIS — K4090 Unilateral inguinal hernia, without obstruction or gangrene, not specified as recurrent: Secondary | ICD-10-CM

## 2011-07-06 DIAGNOSIS — I2510 Atherosclerotic heart disease of native coronary artery without angina pectoris: Secondary | ICD-10-CM | POA: Diagnosis present

## 2011-07-06 DIAGNOSIS — R1013 Epigastric pain: Secondary | ICD-10-CM

## 2011-07-06 DIAGNOSIS — K469 Unspecified abdominal hernia without obstruction or gangrene: Secondary | ICD-10-CM

## 2011-07-06 DIAGNOSIS — K389 Disease of appendix, unspecified: Secondary | ICD-10-CM

## 2011-07-06 DIAGNOSIS — R933 Abnormal findings on diagnostic imaging of other parts of digestive tract: Secondary | ICD-10-CM

## 2011-07-06 DIAGNOSIS — R109 Unspecified abdominal pain: Secondary | ICD-10-CM

## 2011-07-06 DIAGNOSIS — Z8546 Personal history of malignant neoplasm of prostate: Secondary | ICD-10-CM

## 2011-07-06 HISTORY — PX: LAPAROSCOPIC APPENDECTOMY: SHX408

## 2011-07-06 LAB — BASIC METABOLIC PANEL
BUN: 18 mg/dL (ref 6–23)
CO2: 23 mEq/L (ref 19–32)
Chloride: 108 mEq/L (ref 96–112)
Creatinine, Ser: 0.9 mg/dL (ref 0.50–1.35)
GFR calc Af Amer: 82 mL/min — ABNORMAL LOW (ref >90)
Potassium: 3.7 mEq/L (ref 3.5–5.1)

## 2011-07-06 LAB — CBC
Hemoglobin: 12.4 g/dL — ABNORMAL LOW (ref 13.0–17.0)
RBC: 3.87 MIL/uL — ABNORMAL LOW (ref 4.22–5.81)
WBC: 9.1 10*3/uL (ref 4.0–10.5)

## 2011-07-06 LAB — DIFFERENTIAL
Basophils Relative: 0 % (ref 0–1)
Eosinophils Relative: 2 % (ref 0–5)
Lymphs Abs: 1.7 10*3/uL (ref 0.7–4.0)
Monocytes Absolute: 0.9 10*3/uL (ref 0.1–1.0)
Monocytes Relative: 10 % (ref 3–12)
Neutro Abs: 6.3 10*3/uL (ref 1.7–7.7)

## 2011-07-06 SURGERY — APPENDECTOMY, LAPAROSCOPIC
Anesthesia: General | Wound class: Contaminated

## 2011-07-06 MED ORDER — SODIUM CHLORIDE 0.9 % IV SOLN
Freq: Once | INTRAVENOUS | Status: AC
  Administered 2011-07-06: 05:00:00 via INTRAVENOUS

## 2011-07-06 MED ORDER — ACETAMINOPHEN 325 MG PO TABS: 650.0000 mg | ORAL_TABLET | Freq: Four times a day (QID) | ORAL | Status: DC | PRN

## 2011-07-06 MED ORDER — MORPHINE SULFATE 2 MG/ML IJ SOLN: 1.0000 mg | INTRAMUSCULAR | Status: DC | PRN

## 2011-07-06 MED ORDER — ONDANSETRON HCL 4 MG/2ML IJ SOLN: 4.0000 mg | Freq: Four times a day (QID) | INTRAMUSCULAR | Status: DC | PRN

## 2011-07-06 MED ORDER — SODIUM CHLORIDE 0.9 % IV SOLN
1.0000 g | INTRAVENOUS | Status: DC
  Filled 2011-07-06 (×2): qty 1

## 2011-07-06 MED ORDER — CLOPIDOGREL BISULFATE 75 MG PO TABS
75.0000 mg | ORAL_TABLET | Freq: Every day | ORAL | Status: DC
  Administered 2011-07-06 – 2011-07-09 (×4): 75 mg via ORAL
  Filled 2011-07-06 (×5): qty 1

## 2011-07-06 MED ORDER — SUCCINYLCHOLINE CHLORIDE 20 MG/ML IJ SOLN
INTRAMUSCULAR | Status: DC | PRN
  Administered 2011-07-06: 80 mg via INTRAVENOUS

## 2011-07-06 MED ORDER — LACTATED RINGERS IV SOLN
INTRAVENOUS | Status: DC
  Administered 2011-07-06: 1000 mL via INTRAVENOUS

## 2011-07-06 MED ORDER — KCL IN DEXTROSE-NACL 20-5-0.45 MEQ/L-%-% IV SOLN
INTRAVENOUS | Status: DC
  Filled 2011-07-06 (×3): qty 1000

## 2011-07-06 MED ORDER — ISOSORBIDE MONONITRATE ER 60 MG PO TB24
60.0000 mg | ORAL_TABLET | Freq: Every day | ORAL | Status: DC
  Administered 2011-07-06 – 2011-07-09 (×4): 60 mg via ORAL
  Filled 2011-07-06 (×5): qty 1

## 2011-07-06 MED ORDER — SODIUM CHLORIDE 0.9 % IR SOLN
Status: DC | PRN
  Administered 2011-07-06: 1000 mL

## 2011-07-06 MED ORDER — POTASSIUM CHLORIDE IN NACL 20-0.9 MEQ/L-% IV SOLN
INTRAVENOUS | Status: AC
  Filled 2011-07-06: qty 1000

## 2011-07-06 MED ORDER — HYDROCODONE-ACETAMINOPHEN 5-325 MG PO TABS: 1.0000 | ORAL_TABLET | ORAL | Status: DC | PRN

## 2011-07-06 MED ORDER — NEOSTIGMINE METHYLSULFATE 1 MG/ML IJ SOLN
INTRAMUSCULAR | Status: DC | PRN
  Administered 2011-07-06: .4 mg via INTRAVENOUS

## 2011-07-06 MED ORDER — ACETAMINOPHEN 325 MG PO TABS: 650.0000 mg | ORAL_TABLET | ORAL | Status: DC | PRN

## 2011-07-06 MED ORDER — AMLODIPINE BESYLATE 2.5 MG PO TABS
2.5000 mg | ORAL_TABLET | Freq: Every day | ORAL | Status: DC
  Administered 2011-07-06 – 2011-07-09 (×4): 2.5 mg via ORAL
  Filled 2011-07-06 (×5): qty 1

## 2011-07-06 MED ORDER — ONDANSETRON HCL 4 MG PO TABS: 4.0000 mg | ORAL_TABLET | Freq: Four times a day (QID) | ORAL | Status: DC | PRN

## 2011-07-06 MED ORDER — FENTANYL CITRATE 0.05 MG/ML IJ SOLN
50.0000 ug | Freq: Once | INTRAMUSCULAR | Status: AC
  Administered 2011-07-06: 50 ug via INTRAVENOUS
  Filled 2011-07-06: qty 2

## 2011-07-06 MED ORDER — METOPROLOL SUCCINATE ER 25 MG PO TB24
25.0000 mg | ORAL_TABLET | Freq: Every day | ORAL | Status: DC
  Administered 2011-07-06 – 2011-07-09 (×4): 25 mg via ORAL
  Filled 2011-07-06 (×5): qty 1

## 2011-07-06 MED ORDER — FENTANYL CITRATE 0.05 MG/ML IJ SOLN: 25.0000 ug | INTRAMUSCULAR | Status: DC | PRN

## 2011-07-06 MED ORDER — POTASSIUM CHLORIDE IN NACL 20-0.9 MEQ/L-% IV SOLN
INTRAVENOUS | Status: DC
  Administered 2011-07-06 – 2011-07-08 (×2): via INTRAVENOUS
  Filled 2011-07-06 (×6): qty 1000

## 2011-07-06 MED ORDER — LACTATED RINGERS IV SOLN: INTRAVENOUS | Status: DC

## 2011-07-06 MED ORDER — OXYBUTYNIN CHLORIDE ER 10 MG PO TB24
10.0000 mg | ORAL_TABLET | Freq: Every day | ORAL | Status: DC
  Administered 2011-07-06 – 2011-07-09 (×4): 10 mg via ORAL
  Filled 2011-07-06 (×5): qty 1

## 2011-07-06 MED ORDER — MEPERIDINE HCL 50 MG/ML IJ SOLN: 6.2500 mg | INTRAMUSCULAR | Status: DC | PRN

## 2011-07-06 MED ORDER — FENTANYL CITRATE 0.05 MG/ML IJ SOLN
INTRAMUSCULAR | Status: DC | PRN
  Administered 2011-07-06: 25 ug via INTRAVENOUS
  Administered 2011-07-06: 50 ug via INTRAVENOUS
  Administered 2011-07-06: 25 ug via INTRAVENOUS
  Administered 2011-07-06: 50 ug via INTRAVENOUS

## 2011-07-06 MED ORDER — BUPIVACAINE-EPINEPHRINE (PF) 0.5% -1:200000 IJ SOLN
INTRAMUSCULAR | Status: AC
  Filled 2011-07-06: qty 10

## 2011-07-06 MED ORDER — ACETAMINOPHEN 650 MG RE SUPP: 650.0000 mg | Freq: Four times a day (QID) | RECTAL | Status: DC | PRN

## 2011-07-06 MED ORDER — PROPOFOL 10 MG/ML IV EMUL
INTRAVENOUS | Status: DC | PRN
  Administered 2011-07-06: 80 mg via INTRAVENOUS

## 2011-07-06 MED ORDER — HETASTARCH-ELECTROLYTES 6 % IV SOLN
INTRAVENOUS | Status: DC | PRN
  Administered 2011-07-06: 10:00:00 via INTRAVENOUS

## 2011-07-06 MED ORDER — ERTAPENEM SODIUM 1 G IJ SOLR
1.0000 g | INTRAMUSCULAR | Status: AC
  Administered 2011-07-07: 1 g via INTRAVENOUS
  Filled 2011-07-06: qty 1

## 2011-07-06 MED ORDER — LIDOCAINE HCL (CARDIAC) 20 MG/ML IV SOLN
INTRAVENOUS | Status: DC | PRN
  Administered 2011-07-06: 30 mg via INTRAVENOUS

## 2011-07-06 MED ORDER — LACTATED RINGERS IV SOLN
INTRAVENOUS | Status: DC | PRN
  Administered 2011-07-06: 09:00:00 via INTRAVENOUS

## 2011-07-06 MED ORDER — PHENYLEPHRINE HCL 10 MG/ML IJ SOLN
INTRAMUSCULAR | Status: DC | PRN
  Administered 2011-07-06: 80 ug via INTRAVENOUS

## 2011-07-06 MED ORDER — ONDANSETRON HCL 4 MG/2ML IJ SOLN
INTRAMUSCULAR | Status: DC | PRN
  Administered 2011-07-06: 4 mg via INTRAVENOUS

## 2011-07-06 MED ORDER — CEFAZOLIN SODIUM 1-5 GM-% IV SOLN
INTRAVENOUS | Status: AC
  Filled 2011-07-06: qty 50

## 2011-07-06 MED ORDER — ROCURONIUM BROMIDE 100 MG/10ML IV SOLN
INTRAVENOUS | Status: DC | PRN
  Administered 2011-07-06: 10 mg via INTRAVENOUS
  Administered 2011-07-06: 25 mg via INTRAVENOUS

## 2011-07-06 MED ORDER — BUPIVACAINE-EPINEPHRINE 0.5% -1:200000 IJ SOLN
INTRAMUSCULAR | Status: DC | PRN
  Administered 2011-07-06: 30 mL

## 2011-07-06 MED ORDER — SODIUM CHLORIDE 0.9 % IV SOLN
1.0000 g | Freq: Once | INTRAVENOUS | Status: AC
  Administered 2011-07-06: 1 g via INTRAVENOUS
  Filled 2011-07-06 (×2): qty 1

## 2011-07-06 MED ORDER — PROMETHAZINE HCL 25 MG/ML IJ SOLN: 6.2500 mg | INTRAMUSCULAR | Status: DC | PRN

## 2011-07-06 MED ORDER — BUPIVACAINE LIPOSOME 1.3 % IJ SUSP
20.0000 mL | Freq: Once | INTRAMUSCULAR | Status: DC
  Filled 2011-07-06: qty 20

## 2011-07-06 MED ORDER — RAMIPRIL 10 MG PO CAPS
10.0000 mg | ORAL_CAPSULE | Freq: Every day | ORAL | Status: DC
  Administered 2011-07-06 – 2011-07-09 (×4): 10 mg via ORAL
  Filled 2011-07-06 (×5): qty 1

## 2011-07-06 SURGICAL SUPPLY — 51 items
BENZOIN TINCTURE PRP APPL 2/3 (GAUZE/BANDAGES/DRESSINGS) IMPLANT
BLADE EXTENDED COATED 6.5IN (ELECTRODE) IMPLANT
BLADE HEX COATED 2.75 (ELECTRODE) IMPLANT
CANISTER SUCTION 2500CC (MISCELLANEOUS) ×2 IMPLANT
CLOTH BEACON ORANGE TIMEOUT ST (SAFETY) ×2 IMPLANT
CUTTER FLEX LINEAR 45M (STAPLE) ×2 IMPLANT
DERMABOND ADVANCED (GAUZE/BANDAGES/DRESSINGS) ×1
DERMABOND ADVANCED .7 DNX12 (GAUZE/BANDAGES/DRESSINGS) ×1 IMPLANT
DISSECTOR ROUND CHERRY 3/8 STR (MISCELLANEOUS) IMPLANT
DRAIN CHANNEL 15F RND FF 3/16 (WOUND CARE) IMPLANT
DRAIN CHANNEL RND F F (WOUND CARE) IMPLANT
DRAPE LAPAROSCOPIC ABDOMINAL (DRAPES) ×2 IMPLANT
DRAPE POUCH INSTRU U-SHP 10X18 (DRAPES) IMPLANT
DRAPE UTILITY XL STRL (DRAPES) ×2 IMPLANT
ELECT REM PT RETURN 9FT ADLT (ELECTROSURGICAL) ×2
ELECTRODE REM PT RTRN 9FT ADLT (ELECTROSURGICAL) ×1 IMPLANT
EVACUATOR DRAINAGE 10X20 100CC (DRAIN) IMPLANT
EVACUATOR SILICONE 100CC (DRAIN) IMPLANT
GAUZE SPONGE 4X4 16PLY XRAY LF (GAUZE/BANDAGES/DRESSINGS) ×2 IMPLANT
GLOVE BIOGEL PI IND STRL 7.0 (GLOVE) ×1 IMPLANT
GLOVE BIOGEL PI INDICATOR 7.0 (GLOVE) ×1
GLOVE EUDERMIC 7 POWDERFREE (GLOVE) ×2 IMPLANT
GOWN STRL NON-REIN LRG LVL3 (GOWN DISPOSABLE) ×2 IMPLANT
GOWN STRL REIN XL XLG (GOWN DISPOSABLE) ×4 IMPLANT
KIT BASIN OR (CUSTOM PROCEDURE TRAY) ×2 IMPLANT
NS IRRIG 1000ML POUR BTL (IV SOLUTION) ×2 IMPLANT
POUCH ENDO CATCH II 15MM (MISCELLANEOUS) ×2 IMPLANT
RELOAD 45 VASCULAR/THIN (ENDOMECHANICALS) ×2 IMPLANT
SET IRRIG TUBING LAPAROSCOPIC (IRRIGATION / IRRIGATOR) ×2 IMPLANT
SHEARS CURVED HARMONIC AC 45CM (MISCELLANEOUS) ×2 IMPLANT
SOLUTION ANTI FOG 6CC (MISCELLANEOUS) ×2 IMPLANT
SPONGE GAUZE 4X4 12PLY (GAUZE/BANDAGES/DRESSINGS) IMPLANT
SPONGE LAP 18X18 X RAY DECT (DISPOSABLE) IMPLANT
SPONGE LAP 4X18 X RAY DECT (DISPOSABLE) IMPLANT
STAPLER VISISTAT 35W (STAPLE) IMPLANT
STRIP CLOSURE SKIN 1/2X4 (GAUZE/BANDAGES/DRESSINGS) IMPLANT
SUCTION POOLE TIP (SUCTIONS) IMPLANT
SUT CHROMIC 2 0 SH (SUTURE) IMPLANT
SUT CHROMIC 2 0 TIES 18 (SUTURE) IMPLANT
SUT SILK 2 0 (SUTURE)
SUT SILK 2-0 18XBRD TIE 12 (SUTURE) IMPLANT
SUT SILK 3 0 SH 30 (SUTURE) IMPLANT
SUT VIC AB 0 CT1 27 (SUTURE)
SUT VIC AB 0 CT1 27XBRD ANTBC (SUTURE) IMPLANT
TOWEL OR 17X26 10 PK STRL BLUE (TOWEL DISPOSABLE) ×2 IMPLANT
TRAY FOLEY CATH 14FRSI W/METER (CATHETERS) ×2 IMPLANT
TRAY LAP CHOLE (CUSTOM PROCEDURE TRAY) ×2 IMPLANT
TROCAR BLADELESS OPT 5 75 (ENDOMECHANICALS) ×2 IMPLANT
TROCAR XCEL 12X100 BLDLESS (ENDOMECHANICALS) ×2 IMPLANT
TROCAR XCEL BLUNT TIP 100MML (ENDOMECHANICALS) ×2 IMPLANT
WATER STERILE IRR 1500ML POUR (IV SOLUTION) IMPLANT

## 2011-07-06 NOTE — Anesthesia Postprocedure Evaluation (Signed)
  Anesthesia Post-op Note  Patient: Jordan Little  Procedure(s) Performed: Procedure(s) (LRB): APPENDECTOMY LAPAROSCOPIC (N/A)  Patient Location: PACU  Anesthesia Type: General  Level of Consciousness: awake and alert   Airway and Oxygen Therapy: Patient Spontanous Breathing  Post-op Pain: mild  Post-op Assessment: Post-op Vital signs reviewed, Patient's Cardiovascular Status Stable, Respiratory Function Stable, Patent Airway and No signs of Nausea or vomiting  Post-op Vital Signs: stable  Complications: No apparent anesthesia complications

## 2011-07-06 NOTE — ED Notes (Signed)
GEX:BM84<XL> Expected date:07/06/11<BR> Expected time: 4:17 AM<BR> Means of arrival:Ambulance<BR> Comments:<BR> Appendicitis/call Dr Gerrit Friends

## 2011-07-06 NOTE — ED Notes (Signed)
Per EMS pt began having right sided groin pain last PM denies urinary sx has a hx of kidney stones 40 years ago

## 2011-07-06 NOTE — ED Notes (Signed)
Pt's son Sherian Maroon will meet pt at [facility] ED

## 2011-07-06 NOTE — Progress Notes (Signed)
General surgery attending note:  I have personally interviewed and examined this 78 year old gentleman. I have discussed his presentation and care plan with Dr. Gerrit Friends. I have reviewed his laboratory evaluation and his CT scan.  His right inguinal hernia is reduced and is not a current urgent problem. He is tender in the right lower quadrant on abdominal exam and his CT scan does suggest appendicitis.  I will plan to taken to the operating room for a laparoscopic appendectomy, possible open.  I discussed the indications detail techniques and risks of the surgery with him. He agrees with this plan. He understands these issues. His questions were answered.   Angelia Mould. Derrell Lolling, M.D., [facility] General and Minimally invasive Surgery Breast and Colorectal Surgery Office:   [phone] Pager:   [phone]

## 2011-07-06 NOTE — ED Provider Notes (Addendum)
History     CSN: [id number]  Arrival date & time 07/06/11  0138   First MD Initiated Contact with Patient 07/06/11 0141      Chief Complaint  Patient presents with  . Groin Pain    (Consider location/radiation/quality/duration/timing/severity/associated sxs/prior treatment) HPI This is a 78 year old white male. He complains of pain in his right groin that began yesterday evening about 6 PM. It has worsened since. The pain is moderate to severe, worse with palpation or movement. He is not aware of ever having a hernia. He has had no nausea, vomiting, diarrhea or constipation. He's had no change in urinary function.  Past Medical History  Diagnosis Date  . Prostate cancer     [facility], Dr. Patsi Sears  . CAD (coronary artery disease)     Dr Allyson Sabal  . Hyperlipidemia   . Hypertension   . Diverticulosis 2010    with colon bleed  . Personal history of renal calculi 2007  . Pneumonia 2007    Past Surgical History  Procedure Date  . Coronary angioplasty with stent placement 1999    Dr. Allyson Sabal, 4 vessels  . Transurethral resection of prostate     Dr.   Mickel Crow  . Prostate surgery     Dr. Patsi Sears    History reviewed. No pertinent family history.  History  Substance Use Topics  . Smoking status: Former Games developer  . Smokeless tobacco: Not on file  . Alcohol Use: No      Review of Systems  All other systems reviewed and are negative.    Allergies  Iodine  Home Medications   Current Outpatient Rx  Name Route Sig Dispense Refill  . AMLODIPINE BESYLATE 5 MG PO TABS Oral Take 0.5 tablets (2.5 mg total) by mouth daily. 60 tablet 5  . VITAMIN C 500 MG PO TABS Oral Take 500 mg by mouth daily.      . ASPIRIN 81 MG PO TABS Oral Take 81 mg by mouth daily.      . ATORVASTATIN CALCIUM 10 MG PO TABS Oral Take 10 mg by mouth daily.      Marland Kitchen CLOPIDOGREL BISULFATE 75 MG PO TABS Oral Take 75 mg by mouth daily.      Marland Kitchen GLUCOSAMINE-CHONDROITIN 500-400 MG PO TABS Oral Take 1  tablet by mouth daily.      Marland Kitchen HYDROCODONE-HOMATROPINE 5-1.5 MG/5ML PO SYRP Oral Take by mouth every 6 (six) hours as needed.     . ISOSORBIDE MONONITRATE ER 60 MG PO TB24 Oral Take 60 mg by mouth daily.      Marland Kitchen METOPROLOL SUCCINATE ER 25 MG PO TB24 Oral Take 25 mg by mouth. 1 by mouth twice daily    . OXYBUTYNIN CHLORIDE ER 10 MG PO TB24 Oral Take 10 mg by mouth daily.     Marland Kitchen RAMIPRIL 10 MG PO CAPS  TAKE ONE (1) CAPSULE(S) ONCE DAILY 90 capsule 1    **APPOINTMENT DUE 10/2011**  . RANITIDINE HCL 150 MG PO CAPS Oral Take 1 capsule (150 mg total) by mouth 2 (two) times daily. 60 capsule 2  . TRAMADOL HCL 50 MG PO TABS Oral Take 1 tablet (50 mg total) by mouth every 6 (six) hours as needed for pain. 30 tablet 2    BP 159/78  Pulse 82  Temp(Src) 98.2 F (36.8 C) (Oral)  Resp 20  SpO2 97%  Physical Exam General: Well-developed, well-nourished male in no acute distress; appears younger than age of record HENT: normocephalic, atraumatic  Eyes: pupils equal round and reactive to light; extraocular muscles intact; lens implant Neck: supple Heart: regular rate and rhythm Lungs: clear to auscultation bilaterally Abdomen: soft; nondistended; nontender; bowel sounds present GU: Tender, non-reducible, right inguinal hernia Extremities: No deformity; full range of motion Neurologic: Awake, alert and oriented; motor function intact in all extremities and symmetric; no facial droop Skin: Warm and dry Psychiatric: Normal mood and affect    ED Course  Procedures (including critical care time)     MDM   Nursing notes and vitals signs, including pulse oximetry, reviewed.  Summary of this visit's results, reviewed by myself:  Labs:  Results for orders placed during the hospital encounter of 07/06/11  CBC      Component Value Range   WBC 9.1  4.0 - 10.5 (K/uL)   RBC 3.87 (*) 4.22 - 5.81 (MIL/uL)   Hemoglobin 12.4 (*) 13.0 - 17.0 (g/dL)   HCT 47.8 (*) 29.5 - 52.0 (%)   MCV 94.6  78.0 -  100.0 (fL)   MCH 32.0  26.0 - 34.0 (pg)   MCHC 33.9  30.0 - 36.0 (g/dL)   RDW 62.1  30.8 - 65.7 (%)   Platelets 110 (*) 150 - 400 (K/uL)  DIFFERENTIAL      Component Value Range   Neutrophils Relative 69  43 - 77 (%)   Lymphocytes Relative 19  12 - 46 (%)   Monocytes Relative 10  3 - 12 (%)   Eosinophils Relative 2  0 - 5 (%)   Basophils Relative 0  0 - 1 (%)   Neutro Abs 6.3  1.7 - 7.7 (K/uL)   Lymphs Abs 1.7  0.7 - 4.0 (K/uL)   Monocytes Absolute 0.9  0.1 - 1.0 (K/uL)   Eosinophils Absolute 0.2  0.0 - 0.7 (K/uL)   Basophils Absolute 0.0  0.0 - 0.1 (K/uL)   RBC Morphology ELLIPTOCYTES     Smear Review LARGE PLATELETS PRESENT    BASIC METABOLIC PANEL      Component Value Range   Sodium 142  135 - 145 (mEq/L)   Potassium 3.7  3.5 - 5.1 (mEq/L)   Chloride 108  96 - 112 (mEq/L)   CO2 23  19 - 32 (mEq/L)   Glucose, Bld 111 (*) 70 - 99 (mg/dL)   BUN 18  6 - 23 (mg/dL)   Creatinine, Ser 8.46  0.50 - 1.35 (mg/dL)   Calcium 9.3  8.4 - 96.2 (mg/dL)   GFR calc non Af Amer 71 (*) >90 (mL/min)   GFR calc Af Amer 82 (*) >90 (mL/min)    Imaging Studies: Ct Abdomen Pelvis Wo Contrast  07/06/2011  *RADIOLOGY REPORT*  Clinical Data: Right groin pain.  Right inguinal mass.  History of prostate cancer.  CT ABDOMEN AND PELVIS WITHOUT CONTRAST  Technique:  Multidetector CT imaging of the abdomen and pelvis was performed following the standard protocol without intravenous contrast.  Comparison: 10/15/2010  Findings: Emphysematous changes and fibrosis in the lung bases.  The unenhanced appearance of the liver, spleen, gallbladder, pancreas, and adrenal glands is unremarkable.  There is a small esophageal hiatal hernia.  The stomach and small bowel are decompressed.  Stool filled colon without distension.  Tortuous and calcified abdominal aorta without aneurysm.  No retroperitoneal lymphadenopathy.  Multiple intrarenal calculi in the right kidney, largest in the upper pole measuring 7 mm diameter.   Parapelvic and parenchymal cysts in both kidneys.  Largest cyst is on the left and measures 10 cm  diameter.  Renal changes appear stable since the previous study.  Vascular calcifications throughout the abdomen. No free air or free fluid in the abdomen.  Pelvis:  There is a right inguinal hernia containing fat with infiltration and nodularity within the fat.  This may represent fat necrosis or inflammatory process.  There is no bowel herniation. The appendix is mildly distended at 10 mm with periappendiceal stranding.  This suggests an early acute appendicitis.  No abscess is visualized.  The appendix extends close to the neck of the inguinal hernia and the changes within the hernia made be related to the appendicitis or could represent separate fat necrosis.  Diverticulosis of the sigmoid colon without diverticulitis changes. No free or loculated pelvic fluid collections.  The bladder wall is not thickened.  The prostate gland is not enlarged.  No significant pelvic lymphadenopathy.  Degenerative changes in the lumbar spine.  IMPRESSION: Mild appendiceal distension with periappendiceal inflammatory changes suggesting early appendicitis.  No abscess.  Right inguinal hernia containing fat with infiltration and nodularity within the fat suggesting fat necrosis or extension of inflammatory process from the appendix.  No bowel herniation or obstruction.  Results were discussed with Dr. Read Drivers at the time of dictation, 0242 hours on 07/06/2011.  Original Report Authenticated By: Marlon Pel, M.D.   3:30 AM Dr. Gerrit Friends will see patient in ED at [facility].          Hanley Seamen, MD 07/06/11 1478  Hanley Seamen, MD 07/06/11 0330

## 2011-07-06 NOTE — ED Notes (Signed)
Brand Males Charge Rn notified of pt arrival

## 2011-07-06 NOTE — Anesthesia Preprocedure Evaluation (Signed)
Anesthesia Evaluation  Patient identified by MRN, date of birth, ID band Patient awake    Reviewed: Allergy & Precautions, H&P , NPO status , Patient's Chart, lab work & pertinent test results  Airway Mallampati: III TM Distance: >3 FB Neck ROM: Full  Mouth opening: Limited Mouth Opening  Dental No notable dental hx.    Pulmonary neg pulmonary ROS,  breath sounds clear to auscultation  Pulmonary exam normal       Cardiovascular hypertension, Pt. on medications + CAD (CAD s/p stent) negative cardio ROS  Rhythm:Regular Rate:Normal     Neuro/Psych negative neurological ROS  negative psych ROS   GI/Hepatic negative GI ROS, Neg liver ROS, GERD-  Medicated and Controlled,  Endo/Other  negative endocrine ROS  Renal/GU negative Renal ROS  negative genitourinary   Musculoskeletal negative musculoskeletal ROS (+)   Abdominal   Peds negative pediatric ROS (+)  Hematology negative hematology ROS (+)   Anesthesia Other Findings   Reproductive/Obstetrics negative OB ROS                           Anesthesia Physical Anesthesia Plan  ASA: III  Anesthesia Plan: General   Post-op Pain Management:    Induction: Intravenous  Airway Management Planned:   Additional Equipment:   Intra-op Plan:   Post-operative Plan: Extubation in OR  Informed Consent: I have reviewed the patients History and Physical, chart, labs and discussed the procedure including the risks, benefits and alternatives for the proposed anesthesia with the patient or authorized representative who has indicated his/her understanding and acceptance.   Dental advisory given  Plan Discussed with: CRNA  Anesthesia Plan Comments:         Anesthesia Quick Evaluation

## 2011-07-06 NOTE — Op Note (Signed)
Patient Name:           Jordan Little   Date of Surgery:        07/06/2011  Pre op Diagnosis:      Acute appendicitis  Post op Diagnosis:    Acute suppurative appendicitis  Procedure:                 Laparoscopic appendectomy  Surgeon:                     Angelia Mould. Derrell Lolling, M.D., FACS  Assistant:                     none  Operative Indications:   This is a 78 year old Caucasian gentleman, in reasonable health. He presented with right lower quadrant abdominal pain and right groin pain. He had an incarcerated right inguinal hernia which was reduced but he still had pain a CT scan showed what appeared to be an acutely inflamed appendix without abscess or rupture. Exam revealed right lower quadrant tenderness. Lab work and white count were normal. I felt that his physical findings, history and CT scan were convincing for probable appendicitis and he is brought to the operating room urgently  Operative Findings:       The patient had acute suppurative appendicitis. We could clearly visualize the terminal ileum, ileocecal valve, appendix, cecum and ascending colon and liver. The appendix had exudate on it but it had not ruptured.  Procedure in Detail:          Following the induction of general endotracheal anesthesia the patient's abdomen was prepped and draped in a sterile fashion. Intravenous antibiotics were given. Surgical time out was performed. 0.5% Marcaine with epinephrine was used as a local infiltration anesthetic. A vertically oriented incision was made at the superior rim of the umbilicus. The fascia was incised in the midline and the abdominal cavity entered under direct vision. An 11 mm Hassan trocar was inserted and secured with a pursestring suture of 0 Vicryl. Pneumoperitoneum was created. A camera was inserted. 5 mm trocar placed in the left lower quadrant and 12 mm trocar placed in the suprapubic area. After survey of the abdomen and pelvis we lifted up the tip of the appendix and  divided a little bit of the lateral peritoneal attachments so as to mobilize the appendix and cecum medially. We could then divide the appendiceal mesentery and appendiceal artery with the harmonic scalpel in several  steps. Once this was done we had the appendix completely skeletonized and we could clearly see the junction of the appendix with the cecum. We stapled off the base of the appendix with an endo- GIA stapling device, placed it in a specimen bag and removed it. We inspected the staple line it looked very good. There were no retained staples. We irrigated this area but there was no bleeding or further periods. The trocars were removed and there was no bleeding from the trocar sites. The pneumoperitoneum was released. The fascia at the umbilicus was closed with 0 Vicryl sutures and the skin incisions closed with subcuticular sutures of 4-0 Monocryl and Dermabond. The patient was taken to recovery in stable condition. There were no complications. Counts correct. EBL 10 cc.     Angelia Mould. Derrell Lolling, M.D., FACS General and Minimally Invasive Surgery Breast and Colorectal Surgery  07/06/2011 10:52 AM

## 2011-07-06 NOTE — ED Notes (Signed)
Dr gerkin notified of duplicate invanz order; verbal d/c given.

## 2011-07-06 NOTE — H&P (Signed)
Jordan Little is an 78 y.o. male.   Chief Complaint: Abdominal pain HPI: pt is a 78yo WM who presented to [facility] with right groin pain.  Patient noted with incarcerated RIH of 24 hrs duration.  ER MD unable to reduce.  CT Abd & Pelvis obtained and demonstrated fatty tissue in hernia, no bowel obstruction, and evidence of acute appendicitis.  WBC normal.  No fever.  Transferred to [facility] ER for surgical evaluation.  Past Medical History  Diagnosis Date  . Prostate cancer     [facility], Dr. Patsi Sears  . CAD (coronary artery disease)     Dr Allyson Sabal  . Hyperlipidemia   . Hypertension   . Diverticulosis 2010    with colon bleed  . Personal history of renal calculi 2007  . Pneumonia 2007    Past Surgical History  Procedure Date  . Coronary angioplasty with stent placement 1999    Dr. Allyson Sabal, 4 vessels  . Transurethral resection of prostate     Dr.   Mickel Crow  . Prostate surgery     Dr. Patsi Sears    History reviewed. No pertinent family history. Social History:  reports that he has quit smoking. He does not have any smokeless tobacco history on file. He reports that he does not drink alcohol or use illicit drugs.  Allergies:  Allergies  Allergen Reactions  . Iodine     Throat swelling    Medications Prior to Admission  Medication Dose Route Frequency Provider Last Rate Last Dose  . 0.9 %  sodium chloride infusion   Intravenous Once John L Molpus, MD      . ertapenem [facility]) 1 g in sodium chloride 0.9 % 50 mL IVPB  1 g Intravenous Once Carlisle Beers Molpus, MD   1 g at 07/06/11 0321  . fentaNYL (SUBLIMAZE) injection 50 mcg  50 mcg Intravenous Once Carlisle Beers Molpus, MD   50 mcg at 07/06/11 0155   Medications Prior to Admission  Medication Sig Dispense Refill  . amLODipine (NORVASC) 5 MG tablet Take 0.5 tablets (2.5 mg total) by mouth daily.  60 tablet  5  . Ascorbic Acid (VITAMIN C) 500 MG tablet Take 500 mg by mouth daily.        Marland Kitchen aspirin 81 MG tablet Take 81 mg by mouth  daily.        Marland Kitchen atorvastatin (LIPITOR) 10 MG tablet Take 10 mg by mouth daily.        . clopidogrel (PLAVIX) 75 MG tablet Take 75 mg by mouth daily.        Marland Kitchen glucosamine-chondroitin 500-400 MG tablet Take 1 tablet by mouth daily.        . isosorbide mononitrate (IMDUR) 60 MG 24 hr tablet Take 60 mg by mouth daily.        . metoprolol succinate (TOPROL-XL) 25 MG 24 hr tablet Take 25 mg by mouth. 1 by mouth twice daily      . oxybutynin (DITROPAN-XL) 10 MG 24 hr tablet Take 10 mg by mouth daily.       . ramipril (ALTACE) 10 MG capsule TAKE ONE (1) CAPSULE(S) ONCE DAILY  90 capsule  1  . ranitidine (ZANTAC) 150 MG capsule Take 1 capsule (150 mg total) by mouth 2 (two) times daily.  60 capsule  2  . traMADol (ULTRAM) 50 MG tablet Take 1 tablet (50 mg total) by mouth every 6 (six) hours as needed for pain.  30 tablet  2  Results for orders placed during the hospital encounter of 07/06/11 (from the past 48 hour(s))  CBC     Status: Abnormal   Collection Time   07/06/11  2:45 AM      Component Value Range Comment   WBC 9.1  4.0 - 10.5 (K/uL)    RBC 3.87 (*) 4.22 - 5.81 (MIL/uL)    Hemoglobin 12.4 (*) 13.0 - 17.0 (g/dL)    HCT 04.5 (*) 40.9 - 52.0 (%)    MCV 94.6  78.0 - 100.0 (fL)    MCH 32.0  26.0 - 34.0 (pg)    MCHC 33.9  30.0 - 36.0 (g/dL)    RDW 81.1  91.4 - 78.2 (%)    Platelets 110 (*) 150 - 400 (K/uL)   DIFFERENTIAL     Status: Normal   Collection Time   07/06/11  2:45 AM      Component Value Range Comment   Neutrophils Relative 69  43 - 77 (%)    Lymphocytes Relative 19  12 - 46 (%)    Monocytes Relative 10  3 - 12 (%)    Eosinophils Relative 2  0 - 5 (%)    Basophils Relative 0  0 - 1 (%)    Neutro Abs 6.3  1.7 - 7.7 (K/uL)    Lymphs Abs 1.7  0.7 - 4.0 (K/uL)    Monocytes Absolute 0.9  0.1 - 1.0 (K/uL)    Eosinophils Absolute 0.2  0.0 - 0.7 (K/uL)    Basophils Absolute 0.0  0.0 - 0.1 (K/uL)    RBC Morphology ELLIPTOCYTES      Smear Review LARGE PLATELETS PRESENT   PLATELETS  APPEAR ADEQUATE  BASIC METABOLIC PANEL     Status: Abnormal   Collection Time   07/06/11  2:45 AM      Component Value Range Comment   Sodium 142  135 - 145 (mEq/L)    Potassium 3.7  3.5 - 5.1 (mEq/L)    Chloride 108  96 - 112 (mEq/L)    CO2 23  19 - 32 (mEq/L)    Glucose, Bld 111 (*) 70 - 99 (mg/dL)    BUN 18  6 - 23 (mg/dL)    Creatinine, Ser 9.56  0.50 - 1.35 (mg/dL)    Calcium 9.3  8.4 - 10.5 (mg/dL)    GFR calc non Af Amer 71 (*) >90 (mL/min)    GFR calc Af Amer 82 (*) >90 (mL/min)    Ct Abdomen Pelvis Wo Contrast  07/06/2011  *RADIOLOGY REPORT*  Clinical Data: Right groin pain.  Right inguinal mass.  History of prostate cancer.  CT ABDOMEN AND PELVIS WITHOUT CONTRAST  Technique:  Multidetector CT imaging of the abdomen and pelvis was performed following the standard protocol without intravenous contrast.  Comparison: 10/15/2010  Findings: Emphysematous changes and fibrosis in the lung bases.  The unenhanced appearance of the liver, spleen, gallbladder, pancreas, and adrenal glands is unremarkable.  There is a small esophageal hiatal hernia.  The stomach and small bowel are decompressed.  Stool filled colon without distension.  Tortuous and calcified abdominal aorta without aneurysm.  No retroperitoneal lymphadenopathy.  Multiple intrarenal calculi in the right kidney, largest in the upper pole measuring 7 mm diameter.  Parapelvic and parenchymal cysts in both kidneys.  Largest cyst is on the left and measures 10 cm diameter.  Renal changes appear stable since the previous study.  Vascular calcifications throughout the abdomen. No free air or free fluid in the  abdomen.  Pelvis:  There is a right inguinal hernia containing fat with infiltration and nodularity within the fat.  This may represent fat necrosis or inflammatory process.  There is no bowel herniation. The appendix is mildly distended at 10 mm with periappendiceal stranding.  This suggests an early acute appendicitis.  No abscess is  visualized.  The appendix extends close to the neck of the inguinal hernia and the changes within the hernia made be related to the appendicitis or could represent separate fat necrosis.  Diverticulosis of the sigmoid colon without diverticulitis changes. No free or loculated pelvic fluid collections.  The bladder wall is not thickened.  The prostate gland is not enlarged.  No significant pelvic lymphadenopathy.  Degenerative changes in the lumbar spine.  IMPRESSION: Mild appendiceal distension with periappendiceal inflammatory changes suggesting early appendicitis.  No abscess.  Right inguinal hernia containing fat with infiltration and nodularity within the fat suggesting fat necrosis or extension of inflammatory process from the appendix.  No bowel herniation or obstruction.  Results were discussed with Dr. Read Drivers at the time of dictation, 0242 hours on 07/06/2011.  Original Report Authenticated By: Marlon Pel, M.D.    Review of Systems  Constitutional: Negative.  Negative for fever, chills and diaphoresis.  HENT: Negative.   Eyes: Negative.   Respiratory: Negative.   Cardiovascular: Negative.   Gastrointestinal: Positive for abdominal pain (right groin and RLQ abdomen). Negative for nausea and vomiting.  Genitourinary: Positive for frequency. Negative for dysuria, urgency and hematuria.  Musculoskeletal: Negative.   Skin: Negative.   Neurological: Negative.   Endo/Heme/Allergies: Bruises/bleeds easily.  Psychiatric/Behavioral: Negative.     Blood pressure 156/74, pulse 79, temperature 97.9 F (36.6 C), temperature source Oral, resp. rate 18, SpO2 98.00%. Physical Exam  Constitutional: He is oriented to person, place, and time. He appears well-developed and well-nourished. No distress.  HENT:  Head: Normocephalic and atraumatic.  Right Ear: External ear normal.  Left Ear: External ear normal.  Nose: Nose normal.  Mouth/Throat: Oropharynx is clear and moist.  Eyes: Conjunctivae  and EOM are normal. Pupils are equal, round, and reactive to light. No scleral icterus.  Neck: Normal range of motion. Neck supple. No tracheal deviation present. No thyromegaly present.  Cardiovascular: Normal rate, regular rhythm, normal heart sounds and intact distal pulses.  Exam reveals no gallop and no friction rub.   No murmur heard. Respiratory: Effort normal and breath sounds normal. He has no wheezes. He has no rales.  GI: Soft. Bowel sounds are normal. He exhibits no distension and no mass. There is tenderness (tender RLQ). There is rebound and guarding.  Musculoskeletal: Normal range of motion.  Lymphadenopathy:    He has no cervical adenopathy.  Neurological: He is alert and oriented to person, place, and time. He has normal reflexes.  Skin: Skin is warm and dry. He is not diaphoretic.  Psychiatric: He has a normal mood and affect. His behavior is normal. Judgment and thought content normal.     Assessment/Plan 1.  Early acute appendicitis 2.  RIH, reduced 3.  CAD with stent placement, on Plavix  Patient will be admitted to the general surgery service.  Will go to OR later today for appendectomy.  IV abx to be started in ER.  Pain medication as needed.  Discussed laparoscopic appendectomy with patient and family at bedside.  Discussed possibility of open procedure.  The risks and benefits of the procedure have been discussed at length with the patient.  The patient understands  the proposed procedure, potential alternative treatments, and the course of recovery to be expected.  All of the patient's questions have been answered at this time.  The patient wishes to proceed with surgery.  Velora Heckler, MD, [facility] Office: [phone]    Jordan Little Judie Petit 07/06/2011, 6:24 AM

## 2011-07-06 NOTE — ED Notes (Signed)
Dr Gerrit Friends notified of patients arrival

## 2011-07-06 NOTE — ED Notes (Signed)
MD at bedside. Son in room, consent for surgery obtained. No s/s of distress noted at this time.

## 2011-07-06 NOTE — Transfer of Care (Signed)
Immediate Anesthesia Transfer of Care Note  Patient: Jordan Little  Procedure(s) Performed: Procedure(s) (LRB): APPENDECTOMY LAPAROSCOPIC (N/A)  Patient Location: PACU  Anesthesia Type: General  Level of Consciousness: awake, patient cooperative and responds to stimulation  Airway & Oxygen Therapy: Patient Spontanous Breathing and Patient connected to face mask oxygen  Post-op Assessment: Report given to PACU RN, Post -op Vital signs reviewed and stable and Patient moving all extremities X 4  Post vital signs: stable  Complications: No apparent anesthesia complications

## 2011-07-07 NOTE — Progress Notes (Signed)
Patient ID: Jordan Little, male   DOB: [birth year], 78 y.o.   MRN: [id number] 1 Day Post-Op  Subjective: Mild pain. Tolerating liquids without nausea.  Objective: Vital signs in last 24 hours: Temp:  [97.3 F (36.3 C)-99.4 F (37.4 C)] 97.3 F (36.3 C) (04/06 0956) Pulse Rate:  [54-85] 62  (04/06 0956) Resp:  [17-20] 18  (04/06 0956) BP: (101-160)/(50-78) 145/58 mmHg (04/06 0956) SpO2:  [94 %-100 %] 98 % (04/06 0956) Weight:  [138 lb 14.2 oz (63 kg)] 138 lb 14.2 oz (63 kg) (04/05 1213)    Intake/Output from previous day: 04/05 0701 - 04/06 0700 In: 1700 [I.V.:1200; IV Piggyback:500] Out: 1390 [Urine:1340; Blood:50] Intake/Output this shift:    General appearance: alert and no distress GI: normal findings: soft, with mild right lower quadrant tenderness. Incision/Wound: clean and dry the  Lab Results:   Basename 07/06/11 0245  WBC 9.1  HGB 12.4*  HCT 36.6*  PLT 110*   BMET  Basename 07/06/11 0245  NA 142  K 3.7  CL 108  CO2 23  GLUCOSE 111*  BUN 18  CREATININE 0.90  CALCIUM 9.3     Studies/Results: Ct Abdomen Pelvis Wo Contrast  07/06/2011  *RADIOLOGY REPORT*  Clinical Data: Right groin pain.  Right inguinal mass.  History of prostate cancer.  CT ABDOMEN AND PELVIS WITHOUT CONTRAST  Technique:  Multidetector CT imaging of the abdomen and pelvis was performed following the standard protocol without intravenous contrast.  Comparison: 10/15/2010  Findings: Emphysematous changes and fibrosis in the lung bases.  The unenhanced appearance of the liver, spleen, gallbladder, pancreas, and adrenal glands is unremarkable.  There is a small esophageal hiatal hernia.  The stomach and small bowel are decompressed.  Stool filled colon without distension.  Tortuous and calcified abdominal aorta without aneurysm.  No retroperitoneal lymphadenopathy.  Multiple intrarenal calculi in the right kidney, largest in the upper pole measuring 7 mm diameter.  Parapelvic and parenchymal cysts  in both kidneys.  Largest cyst is on the left and measures 10 cm diameter.  Renal changes appear stable since the previous study.  Vascular calcifications throughout the abdomen. No free air or free fluid in the abdomen.  Pelvis:  There is a right inguinal hernia containing fat with infiltration and nodularity within the fat.  This may represent fat necrosis or inflammatory process.  There is no bowel herniation. The appendix is mildly distended at 10 mm with periappendiceal stranding.  This suggests an early acute appendicitis.  No abscess is visualized.  The appendix extends close to the neck of the inguinal hernia and the changes within the hernia made be related to the appendicitis or could represent separate fat necrosis.  Diverticulosis of the sigmoid colon without diverticulitis changes. No free or loculated pelvic fluid collections.  The bladder wall is not thickened.  The prostate gland is not enlarged.  No significant pelvic lymphadenopathy.  Degenerative changes in the lumbar spine.  IMPRESSION: Mild appendiceal distension with periappendiceal inflammatory changes suggesting early appendicitis.  No abscess.  Right inguinal hernia containing fat with infiltration and nodularity within the fat suggesting fat necrosis or extension of inflammatory process from the appendix.  No bowel herniation or obstruction.  Results were discussed with Dr. Read Drivers at the time of dictation, 0242 hours on 07/06/2011.  Original Report Authenticated By: Marlon Pel, M.D.    Anti-infectives: Anti-infectives     Start     Dose/Rate Route Frequency Ordered Stop   07/07/11 0300   ertapenem (  INVANZ) 1 g in sodium chloride 0.9 % 50 mL IVPB        1 g 100 mL/hr over 30 Minutes Intravenous Every 24 hours 07/06/11 1221 07/07/11 0338   07/06/11 0645   ertapenem (INVANZ) 1 g in sodium chloride 0.9 % 50 mL IVPB  Status:  Discontinued        1 g 100 mL/hr over 30 Minutes Intravenous Every 24 hours 07/06/11 0636 07/06/11  1221   07/06/11 0300   ertapenem (INVANZ) 1 g in sodium chloride 0.9 % 50 mL IVPB        1 g 100 mL/hr over 30 Minutes Intravenous  Once 07/06/11 0248 07/06/11 0447          Assessment/Plan: s/p Procedure(s): APPENDECTOMY LAPAROSCOPIC Doing well postoperatively. Will advance diet and ambulate today. Probable discharge tomorrow.   LOS: 1 day    Caitlyne Ingham T 07/07/2011

## 2011-07-07 NOTE — Plan of Care (Signed)
Problem: Phase II Progression Outcomes Goal: Foley discontinued Outcome: Not Applicable Date Met:  07/07/11 No foley.

## 2011-07-08 NOTE — Progress Notes (Signed)
Patient ID: Jordan Little, male   DOB: July 27, 1915, 78 y.o.   MRN: [id number] 2 Days Post-Op  Subjective: Sore but no other complaints. Tolerating liquid diet without difficulty. Needing some assistance getting in and out of bed.  Objective: Vital signs in last 24 hours: Temp:  [97.3 F (36.3 C)-98.1 F (36.7 C)] 97.7 F (36.5 C) (04/07 0438) Pulse Rate:  [58-62] 58  (04/07 0438) Resp:  [16-22] 20  (04/07 0438) BP: (127-154)/(58-74) 154/74 mmHg (04/07 0438) SpO2:  [94 %-98 %] 94 % (04/07 0438)    Intake/Output from previous day: 04/06 0701 - 04/07 0700 In: 2537.5 [P.O.:700; I.V.:1837.5] Out: 2865 [Urine:2865] Intake/Output this shift:    General appearance: alert and no distress GI: normal findings: soft, non-tender Incision/Wound: clean and dry without signs of infection  Lab Results:   Basename 07/06/11 0245  WBC 9.1  HGB 12.4*  HCT 36.6*  PLT 110*   BMET  Basename 07/06/11 0245  NA 142  K 3.7  CL 108  CO2 23  GLUCOSE 111*  BUN 18  CREATININE 0.90  CALCIUM 9.3     Studies/Results: No results found.  Anti-infectives: Anti-infectives     Start     Dose/Rate Route Frequency Ordered Stop   07/07/11 0300   ertapenem (INVANZ) 1 g in sodium chloride 0.9 % 50 mL IVPB        1 g 100 mL/hr over 30 Minutes Intravenous Every 24 hours 07/06/11 1221 07/07/11 0338   07/06/11 0645   ertapenem (INVANZ) 1 g in sodium chloride 0.9 % 50 mL IVPB  Status:  Discontinued        1 g 100 mL/hr over 30 Minutes Intravenous Every 24 hours 07/06/11 0636 07/06/11 1221   07/06/11 0300   ertapenem (INVANZ) 1 g in sodium chloride 0.9 % 50 mL IVPB        1 g 100 mL/hr over 30 Minutes Intravenous  Once 07/06/11 0248 07/06/11 0447          Assessment/Plan: s/p Procedure(s): APPENDECTOMY LAPAROSCOPIC Doing well without complication identified. He will need to transition back to his apartment at  assisted living and we will plan for this tomorrow.   LOS: 2 days     Timisha Mondry T 07/08/2011

## 2011-07-09 MED ORDER — POLYETHYLENE GLYCOL 3350 17 G PO PACK: 17.0000 g | PACK | Freq: Every day | ORAL | Status: AC | PRN

## 2011-07-09 MED ORDER — ACETAMINOPHEN 325 MG PO TABS: 650.0000 mg | ORAL_TABLET | ORAL | Status: AC | PRN

## 2011-07-09 MED ORDER — POLYETHYLENE GLYCOL 3350 17 G PO PACK
17.0000 g | PACK | Freq: Every day | ORAL | Status: DC
  Administered 2011-07-09: 17 g via ORAL
  Filled 2011-07-09 (×2): qty 1

## 2011-07-09 MED ORDER — HYDROCODONE-ACETAMINOPHEN 5-325 MG PO TABS: 1.0000 | ORAL_TABLET | Freq: Four times a day (QID) | ORAL | Status: AC | PRN

## 2011-07-09 NOTE — Progress Notes (Signed)
CSW assisted pt with d/c planning back to [facility] today. Pt will be in SNF at [facility]. He was in the ALF but due to medical status [facility] is now needed. Family provided transport back to [place] .  Cori Razor  LCSW  [phone]

## 2011-07-09 NOTE — Discharge Instructions (Signed)
[facility] ______[facility] LAPAROSCOPIC SURGERY: POST OP INSTRUCTIONS Always review your discharge instruction sheet given to you by the facility where your surgery was performed. IF YOU HAVE DISABILITY OR FAMILY LEAVE FORMS, YOU MUST BRING THEM TO THE OFFICE FOR PROCESSING.   DO NOT GIVE THEM TO YOUR DOCTOR.  1. A prescription for pain medication may be given to you upon discharge.  Take your pain medication as prescribed, if needed.  If narcotic pain medicine is not needed, then you may take acetaminophen (Tylenol) or ibuprofen (Advil) as needed. 2. Take your usually prescribed medications unless otherwise directed. 3. If you need a refill on your pain medication, please contact your pharmacy.  They will contact our office to request authorization. Prescriptions will not be filled after 5pm or on week-ends. 4. You should follow a light diet the first few days after arrival home, such as soup and crackers, etc.  Be sure to include lots of fluids daily. 5. Most patients will experience some swelling and bruising in the area of the incisions.  Ice packs will help.  Swelling and bruising can take several days to resolve.  6. It is common to experience some constipation if taking pain medication after surgery.  Increasing fluid intake and taking a stool softener (such as Colace) will usually help or prevent this problem from occurring.  A mild laxative (Milk of Magnesia or Miralax) should be taken according to package instructions if there are no bowel movements after 48 hours. 7. Unless discharge instructions indicate otherwise, you may remove your bandages 24-48 hours after surgery, and you may shower at that time.  You may have steri-strips (small skin tapes) in place directly over the incision.  These strips should be left on the skin for 7-10 days.  If your surgeon used skin glue on the incision, you may shower in 24 hours.  The glue will flake off over the next 2-3 weeks.  Any sutures or  staples will be removed at the office during your follow-up visit. 8. ACTIVITIES:  You may resume regular (light) daily activities beginning the next day--such as daily self-care, walking, climbing stairs--gradually increasing activities as tolerated.  You may have sexual intercourse when it is comfortable.  Refrain from any heavy lifting or straining until approved by your doctor. a. You may drive when you are no longer taking prescription pain medication, you can comfortably wear a seatbelt, and you can safely maneuver your car and apply brakes. b. RETURN TO WORK:  __________________________________________________________ 9. You should see your doctor in the office for a follow-up appointment approximately 2-3 weeks after your surgery.  Make sure that you call for this appointment within a day or two after you arrive home to insure a convenient appointment time. 10. OTHER INSTRUCTIONS: __________________________________________________________________________________________________________________________ __________________________________________________________________________________________________________________________ WHEN TO CALL YOUR DOCTOR: 1. Fever over 101.0 2. Inability to urinate 3. Continued bleeding from incision. 4. Increased pain, redness, or drainage from the incision. 5. Increasing abdominal pain  The clinic staff is available to answer your questions during regular business hours.  Please don't hesitate to call and ask to speak to one of the nurses for clinical concerns.  If you have a medical emergency, go to the nearest emergency room or call 911.  A surgeon from [facility] is always on call at the hospital. [address], [address], [place], [place]  [address] ? [address] [address], [place], [place]   [address] [phone] ? [phone] ? FAX [phone] Web site:   [email or web address] Laparoscopic Appendectomy Appendectomy is surgery to remove  the appendix. Laparoscopic surgery uses several small cuts (incisions) instead of one large incision. Laparoscopic surgery offers a shorter recovery time and less discomfort. LET YOUR CAREGIVER KNOW ABOUT:  Allergies to food or medicine.   Medicines taken, including vitamins, dietary supplements, herbs, eyedrops, over-the-counter medicines, and creams.   Use of steroids (by mouth or creams).   Previous problems with anesthetics or numbing medicines.   History of bleeding problems or blood clots.   Previous surgery.   Other health problems, including diabetes, heart problems, lung problems, and kidney problems.   Possibility of pregnancy, if this applies.  RISKS AND COMPLICATIONS  Infection. A germ starts growing in the wound. This can usually be treated with antibiotics. In some cases, the wound will need to be opened and cleaned.   Bleeding.   Damage to other organs.   Sores (abscesses).   Chronic pain at the incision sites. This is defined as pain that lasts for more than 3 months.   Blood clots in the legs that may rarely travel to the lungs.   Infection in the lungs (pneumonia).  BEFORE THE PROCEDURE Appendectomy is usually performed immediately after an inflamed appendix (appendicitis) is diagnosed. No preparation is necessary ahead of this procedure. PROCEDURE  You will be given medicine that makes you sleep (general anesthetic). After you are asleep, a flexible tube (catheter) may be inserted into your bladder to drain your urine during surgery. The tube is removed before you wake up after surgery. When you are asleep, carbondioxide gas will be used to inflate your abdomen. This will allow your surgeon to see inside your abdomen and perform your surgery. Three small incisions will be made in your abdomen. Your surgeon will insert a thin, lighted tube (laparoscope) through one of the incisions. Your surgeon will look through the laparoscope while performing the surgery.  Other tools will be inserted through the other incisions. Laparoscopic procedures may not be appropriate when:  There is major scarring from a previous surgery.   The patient has bleeding disorders.   A pregnancy is near term.   There are other conditions which make the laparoscopic procedure impossible, such as an advanced infection or a ruptured appendix.  If your surgeon feels it is not safe to continue with the laparoscopic procedure, he or she will perform an open surgery instead. This gives the surgeon a larger view and more space to work. Open surgery requires a longer recovery time. After your appendix is removed, your incisions will be closed with stitches (sutures) or skin adhesive. AFTER THE PROCEDURE You will be taken to a recovery room. When the anesthesia has worn off, you will be returned to your hospital room. You will be given pain medicines to keep you comfortable. Ask your caregiver how long your hospital stay will be. Document Released: 11/01/2003 Document Revised: 03/08/2011 Document Reviewed: 09/26/2010 [employer] Patient Information 2012 [employer]. 

## 2011-07-09 NOTE — Progress Notes (Signed)
Seems to be ready to go - if appropriate arrangements for care can be made

## 2011-07-09 NOTE — Progress Notes (Signed)
3 Days Post-Op  Subjective: Afebrile, VSS, No labs, Had breakfast and says he's doing well uses cane and Walker at [employer].  He says his place is a long way from dinning room. Objective: Vital signs in last 24 hours: Temp:  [97.4 F (36.3 C)-97.5 F (36.4 C)] 97.4 F (36.3 C) (04/08 0536) Pulse Rate:  [59-74] 66  (04/08 0536) Resp:  [18-22] 20  (04/08 0536) BP: (112-164)/(69-80) 143/69 mmHg (04/08 0536) SpO2:  [94 %-96 %] 94 % (04/08 0536)    Intake/Output from previous day: 04/07 0701 - 04/08 0700 In: 965.9 [P.O.:360; I.V.:605.9] Out: 1375 [Urine:1375] Intake/Output this shift:    General appearance: alert, cooperative and no distress Resp: clear to auscultation bilaterally and few rales at the base. GI: soft, non-tender; bowel sounds normal; no masses,  no organomegaly and Incisions look good  Lab Results:  No results found for this basename: WBC:2,HGB:2,HCT:2,PLT:2 in the last 72 hours  BMET No results found for this basename: NA:2,K:2,CL:2,CO2:2,GLUCOSE:2,BUN:2,CREATININE:2,CALCIUM:2 in the last 72 hours PT/INR No results found for this basename: LABPROT:2,INR:2 in the last 72 hours  No results found for this basename: AST:5,ALT:5,ALKPHOS:5,BILITOT:5,PROT:5,ALBUMIN:5 in the last 168 hours   Lipase  No results found for this basename: lipase     Studies/Results: No results found.  Medications:    . amLODipine  2.5 mg Oral Daily  . clopidogrel  75 mg Oral Q breakfast  . isosorbide mononitrate  60 mg Oral Daily  . metoprolol succinate  25 mg Oral Daily  . oxybutynin  10 mg Oral Daily  . ramipril  10 mg Oral Daily    Assessment/Plan Acute suppurative appendicitis s/p Laparoscopic appendectomy 07/06/2011 Dr. Derrell Lolling CAD/S/P CABG Prostate Ca Hyperlipidemia  Hypertension Diverticulosis  2010  with colon bleed  Personal history of renal calculi  Pneumonia   Plan:  Ask case Manager to see and be sure arrangements are complete for his discharge back to  Assisted living.  Rx with a dose of Miralax and aim for D/C home when everything is in place.   LOS: 3 days    Maxcine Strong 07/09/2011

## 2011-07-09 NOTE — Discharge Summary (Signed)
Physician Discharge Summary  Patient ID: Jordan Little MRN: [id number] DOB/AGE: 1915-05-10 78 y.o.  Admit date: 07/06/2011 Discharge date: 07/09/2011  Admission Diagnoses: 1. Early acute appendicitis  2. RIH, reduced  3. CAD with stent placement, on Plavix       Discharge Diagnoses: Acute suppurative appendicitis Patient Active Problem List  Diagnoses  . VITAMIN B12 DEFICIENCY  . HYPERLIPIDEMIA  . PERNICIOUS ANEMIA  . PANCYTOPENIA  . HYPERTENSION  . CORONARY ARTERY DISEASE  . FECAL IMPACTION  . DIVERTICULOSIS, COLON  . DIVERTICULITIS OF COLON  . ARTHRALGIA  . ABNORMALITY OF GAIT  . DYSURIA  . NOCTURIA  . PROSTATE CANCER, HX OF  . DIVERTICULAR BLEEDING, HX OF  . BRONCHITIS  . GERD  . CONSTIPATION  . Acute appendicitis   Principal Problem:  *Acute appendicitis   PROCEDURES: Laparoscopic appendectomy 07/06/11 Dr. Blanchie Serve Course: This is a 78 year old Caucasian gentleman, in reasonable health. He presented with right lower quadrant abdominal pain and right groin pain. He had an incarcerated right inguinal hernia which was reduced but he still had pain a CT scan showed what appeared to be an acutely inflamed appendix without abscess or rupture. Exam revealed right lower quadrant tenderness. Lab work and white count were normal. I felt that his physical findings, history and CT scan were convincing for probable appendicitis and he is brought to the operating room urgently.  He tolerated surgery well and returned to floor.  He did very well, but needed some assistance with ambulation.  He used a walker and Cane prior to admission for ambulation.  His diet has been advacned and it is Dr/ [employer] opinion he can go back to Assisted living where he was before admission.  His incisions look good and  The facility is ready to receive him. i have restarted his plavix.  He will need help to and from dinning room till his strength  And balance returns.  Follow up in  2 weeks with  Dr. Derrell Lolling. Resume other care by Primary care physician.   Condition on D/C;  Improved      Disposition: 03-Skilled Nursing Facility  Discharge Orders    Future Appointments: Provider: Department: Dept Phone: Center:   07/12/2011 11:00 AM Pecola Lawless, MD [facility] [phone] [facility]     Medication List  As of 07/09/2011  2:45 PM   TAKE these medications         acetaminophen 325 MG tablet   Commonly known as: TYLENOL   Take 2 tablets (650 mg total) by mouth every 4 (four) hours as needed.      amLODipine 5 MG tablet   Commonly known as: NORVASC   Take 0.5 tablets (2.5 mg total) by mouth daily.      aspirin 81 MG tablet   Take 81 mg by mouth daily.      atorvastatin 10 MG tablet   Commonly known as: LIPITOR   Take 10 mg by mouth daily.      clopidogrel 75 MG tablet   Commonly known as: PLAVIX   Take 75 mg by mouth daily.      glucosamine-chondroitin 500-400 MG tablet   Take 1 tablet by mouth daily.      HYDROcodone-acetaminophen 5-325 MG per tablet   Commonly known as: NORCO   Take 1-2 tablets by mouth every 6 (six) hours as needed.      isosorbide mononitrate 60 MG 24 hr tablet   Commonly known as: IMDUR   Take  60 mg by mouth daily.      metoprolol succinate 25 MG 24 hr tablet   Commonly known as: TOPROL-XL   Take 25 mg by mouth. 1 by mouth twice daily      oxybutynin 10 MG 24 hr tablet   Commonly known as: DITROPAN-XL   Take 10 mg by mouth daily.      polyethylene glycol packet   Commonly known as: MIRALAX / GLYCOLAX   Take 17 g by mouth daily as needed.      ramipril 10 MG capsule   Commonly known as: ALTACE   TAKE ONE (1) CAPSULE(S) ONCE DAILY      ranitidine 150 MG capsule   Commonly known as: ZANTAC   Take 1 capsule (150 mg total) by mouth 2 (two) times daily.      traMADol 50 MG tablet   Commonly known as: ULTRAM   Take 1 tablet (50 mg total) by mouth every 6 (six) hours as needed for pain.      vitamin C 500 MG tablet    Commonly known as: ASCORBIC ACID   Take 500 mg by mouth daily.           Follow-up Information    Follow up with Marga Melnick, MD. Schedule an appointment as soon as possible for a visit in 2 weeks.      Schedule an appointment as soon as possible for a visit with Ernestene Mention, MD.   Contact information:   [facility] [address] [place] [address] [phone]          Signed: Sherrie George 07/09/2011, 2:45 PM

## 2011-07-10 ENCOUNTER — Telehealth (INDEPENDENT_AMBULATORY_CARE_PROVIDER_SITE_OTHER): Payer: Self-pay

## 2011-07-10 NOTE — Telephone Encounter (Signed)
lmom that appt is 07-17-11 arrive at 3:30 pm.

## 2011-07-12 ENCOUNTER — Ambulatory Visit: Payer: Medicare Other | Admitting: Internal Medicine

## 2011-07-17 ENCOUNTER — Encounter (INDEPENDENT_AMBULATORY_CARE_PROVIDER_SITE_OTHER): Payer: Self-pay | Admitting: General Surgery

## 2011-07-17 ENCOUNTER — Ambulatory Visit (INDEPENDENT_AMBULATORY_CARE_PROVIDER_SITE_OTHER): Payer: Medicare Other | Admitting: General Surgery

## 2011-07-17 ENCOUNTER — Encounter (INDEPENDENT_AMBULATORY_CARE_PROVIDER_SITE_OTHER): Payer: Medicare Other | Admitting: General Surgery

## 2011-07-17 VITALS — BP 149/84 | HR 73 | Temp 99.6°F | Ht 68.0 in | Wt 150.0 lb

## 2011-07-17 DIAGNOSIS — K3580 Unspecified acute appendicitis: Secondary | ICD-10-CM

## 2011-07-17 NOTE — Patient Instructions (Signed)
You have recovered from your appendectomy without any obvious complications.  You may return to a normal diet and normal activities and exercise.  Return to see Dr. Derrell Lolling if any new problems arise.

## 2011-07-17 NOTE — Progress Notes (Signed)
Subjective:     Patient ID: Jordan Little, male   DOB: [birth year], 78 y.o.   MRN: [id number]  HPI This 78 year old Caucasian gentleman presented with acute appendicitis and underwent laparoscopic appendectomy approximately 2 weeks ago. He feels well. Minimal incisional pain. Has resumed normal diet. Ambulates with walker. Normal bowel function.  Review of Systems     Objective:   Physical Exam Patient looks well. In no distress. Very alert for his age.  Abdomen is soft and nontender. All the trocar sites are well healed. Small area of ecchymoses suprapubic area.    Assessment:     Acute suppurative appendicitis, uneventful recovery following laparoscopic appendectomy.    Plan:     Resume normal diet and activities and exercise. No restriction.  Return to see me p.r.n.   Angelia Mould. Derrell Lolling, M.D., [facility] General and Minimally invasive Surgery Breast and Colorectal Surgery Office:   [phone] Pager:   [phone]

## 2011-07-20 ENCOUNTER — Encounter (HOSPITAL_COMMUNITY): Payer: Self-pay | Admitting: General Surgery

## 2011-10-16 ENCOUNTER — Encounter (INDEPENDENT_AMBULATORY_CARE_PROVIDER_SITE_OTHER): Payer: Self-pay

## 2012-01-05 ENCOUNTER — Encounter (HOSPITAL_BASED_OUTPATIENT_CLINIC_OR_DEPARTMENT_OTHER): Payer: Self-pay | Admitting: *Deleted

## 2012-01-05 ENCOUNTER — Emergency Department (HOSPITAL_BASED_OUTPATIENT_CLINIC_OR_DEPARTMENT_OTHER)
Admission: EM | Admit: 2012-01-05 | Discharge: 2012-01-05 | Disposition: A | Discharge status: Home / Self Care | Payer: Medicare Other | Attending: Emergency Medicine | Admitting: Emergency Medicine

## 2012-01-05 ENCOUNTER — Emergency Department (HOSPITAL_BASED_OUTPATIENT_CLINIC_OR_DEPARTMENT_OTHER): Payer: Medicare Other

## 2012-01-05 DIAGNOSIS — Z8546 Personal history of malignant neoplasm of prostate: Secondary | ICD-10-CM | POA: Insufficient documentation

## 2012-01-05 DIAGNOSIS — K5730 Diverticulosis of large intestine without perforation or abscess without bleeding: Secondary | ICD-10-CM

## 2012-01-05 DIAGNOSIS — Z79899 Other long term (current) drug therapy: Secondary | ICD-10-CM | POA: Insufficient documentation

## 2012-01-05 DIAGNOSIS — N200 Calculus of kidney: Secondary | ICD-10-CM | POA: Insufficient documentation

## 2012-01-05 DIAGNOSIS — I2510 Atherosclerotic heart disease of native coronary artery without angina pectoris: Secondary | ICD-10-CM | POA: Insufficient documentation

## 2012-01-05 DIAGNOSIS — I10 Essential (primary) hypertension: Secondary | ICD-10-CM | POA: Insufficient documentation

## 2012-01-05 DIAGNOSIS — K5792 Diverticulitis of intestine, part unspecified, without perforation or abscess without bleeding: Secondary | ICD-10-CM

## 2012-01-05 DIAGNOSIS — R1032 Left lower quadrant pain: Secondary | ICD-10-CM | POA: Insufficient documentation

## 2012-01-05 HISTORY — DX: Diverticulitis of intestine, part unspecified, without perforation or abscess without bleeding: K57.92

## 2012-01-05 LAB — CBC WITH DIFFERENTIAL/PLATELET
Basophils Absolute: 0 10*3/uL (ref 0.0–0.1)
HCT: 30.8 % — ABNORMAL LOW (ref 39.0–52.0)
Lymphocytes Relative: 19 % (ref 12–46)
Monocytes Absolute: 1 10*3/uL (ref 0.1–1.0)
Neutro Abs: 4.7 10*3/uL (ref 1.7–7.7)
RDW: 13.7 % (ref 11.5–15.5)
WBC: 7.1 10*3/uL (ref 4.0–10.5)

## 2012-01-05 LAB — URINALYSIS, ROUTINE W REFLEX MICROSCOPIC
Glucose, UA: NEGATIVE mg/dL
Leukocytes, UA: NEGATIVE
Protein, ur: NEGATIVE mg/dL
Specific Gravity, Urine: 1.008 (ref 1.005–1.030)
pH: 5 (ref 5.0–8.0)

## 2012-01-05 LAB — COMPREHENSIVE METABOLIC PANEL
AST: 17 U/L (ref 0–37)
Albumin: 3.7 g/dL (ref 3.5–5.2)
Calcium: 8.9 mg/dL (ref 8.4–10.5)
Creatinine, Ser: 1 mg/dL (ref 0.50–1.35)
GFR calc non Af Amer: 62 mL/min — ABNORMAL LOW (ref >90)
Total Protein: 6.2 g/dL (ref 6.0–8.3)

## 2012-01-05 MED ORDER — SODIUM CHLORIDE 0.9 % IV SOLN: 1000.0000 mL | INTRAVENOUS | Status: DC

## 2012-01-05 MED ORDER — MORPHINE SULFATE 4 MG/ML IJ SOLN
4.0000 mg | Freq: Once | INTRAMUSCULAR | Status: AC
  Administered 2012-01-05: 2 mg via INTRAVENOUS
  Filled 2012-01-05: qty 1

## 2012-01-05 MED ORDER — CIPROFLOXACIN HCL 500 MG PO TABS: 500.0000 mg | ORAL_TABLET | Freq: Two times a day (BID) | ORAL | Status: DC

## 2012-01-05 MED ORDER — METRONIDAZOLE 500 MG PO TABS: 500.0000 mg | ORAL_TABLET | Freq: Two times a day (BID) | ORAL | Status: DC

## 2012-01-05 MED ORDER — SODIUM CHLORIDE 0.9 % IV SOLN
INTRAVENOUS | Status: DC
  Administered 2012-01-05: 13:00:00 via INTRAVENOUS

## 2012-01-05 MED ORDER — ONDANSETRON HCL 4 MG/2ML IJ SOLN
4.0000 mg | Freq: Once | INTRAMUSCULAR | Status: AC
  Administered 2012-01-05: 4 mg via INTRAVENOUS
  Filled 2012-01-05: qty 2

## 2012-01-05 NOTE — ED Provider Notes (Signed)
History     CSN: [id number]  Arrival date & time 01/05/12  1142   First MD Initiated Contact with Patient 01/05/12 1222      Chief Complaint  Patient presents with  . Abdominal Pain    (Consider location/radiation/quality/duration/timing/severity/associated sxs/prior treatment) HPI Comments: The patient is a 78 year old man who presents with a history of left-sided abdominal pain 4 days. He's had no vomiting and no diarrhea. There's been no painful urination and no hematuria. He denies injury. He had appendectomy in April of 2013. He has a prior history of kidney stones and diverticulitis. He lives at an assisted living facility, [facility].  Patient is a 78 y.o. male presenting with abdominal pain. The history is provided by the patient, a relative and medical records. No language interpreter was used.  Abdominal Pain The primary symptoms of the illness include abdominal pain. The primary symptoms of the illness do not include fever, nausea, vomiting, diarrhea or dysuria. The current episode started more than 2 days ago. The onset of the illness was gradual. The problem has been gradually worsening.  Associated with: nothing. The patient has not had a change in bowel habit. Risk factors for an acute abdominal problem include being elderly and a history of abdominal surgery. Additional symptoms associated with the illness include hematuria. Symptoms associated with the illness do not include chills or constipation. Significant associated medical issues include diverticulitis.    Past Medical History  Diagnosis Date  . Prostate cancer     [facility], Dr. Patsi Sears  . CAD (coronary artery disease)     Dr Allyson Sabal  . Hyperlipidemia   . Hypertension   . Diverticulosis 2010    with colon bleed  . Personal history of renal calculi 2007  . Pneumonia 2007    Past Surgical History  Procedure Date  . Coronary angioplasty with stent placement 1999    Dr. Allyson Sabal, 4 vessels  .  Transurethral resection of prostate     Dr.   Mickel Crow  . Prostate surgery     Dr. Patsi Sears  . Laparoscopic appendectomy 07/06/2011    Procedure: APPENDECTOMY LAPAROSCOPIC;  Surgeon: Ernestene Mention, MD;  Location: [facility];  Service: General;  Laterality: N/A;    Family History  Problem Relation Age of Onset  . Cancer Mother   . Cancer Father     prostate    History  Substance Use Topics  . Smoking status: Former Games developer  . Smokeless tobacco: Former Neurosurgeon    Quit date: 07/17/1971  . Alcohol Use: No      Review of Systems  Constitutional: Negative for fever and chills.  HENT: Negative.   Eyes: Negative.   Respiratory: Negative.   Cardiovascular: Negative.   Gastrointestinal: Positive for abdominal pain. Negative for nausea, vomiting, diarrhea and constipation.  Genitourinary: Positive for hematuria. Negative for dysuria.  Musculoskeletal: Negative.   Skin: Negative.   Neurological: Negative.   Psychiatric/Behavioral: Negative.     Allergies  Iodine  Home Medications   Current Outpatient Rx  Name Route Sig Dispense Refill  . ACETAMINOPHEN 325 MG PO TABS Oral Take 2 tablets (650 mg total) by mouth every 4 (four) hours as needed.    Marland Kitchen AMLODIPINE BESYLATE 5 MG PO TABS Oral Take 0.5 tablets (2.5 mg total) by mouth daily. 60 tablet 5  . VITAMIN C 500 MG PO TABS Oral Take 500 mg by mouth daily.      . ASPIRIN 81 MG PO TABS Oral Take 81  mg by mouth daily.      . ATORVASTATIN CALCIUM 10 MG PO TABS Oral Take 10 mg by mouth daily.      Marland Kitchen CLOPIDOGREL BISULFATE 75 MG PO TABS Oral Take 75 mg by mouth daily.      Marland Kitchen GLUCOSAMINE-CHONDROITIN 500-400 MG PO TABS Oral Take 1 tablet by mouth daily.      . ISOSORBIDE MONONITRATE ER 60 MG PO TB24 Oral Take 60 mg by mouth daily.      Marland Kitchen METOPROLOL SUCCINATE ER 25 MG PO TB24 Oral Take 25 mg by mouth. 1 by mouth twice daily    . OXYBUTYNIN CHLORIDE ER 10 MG PO TB24 Oral Take 10 mg by mouth daily.     Marland Kitchen RAMIPRIL 10 MG PO CAPS  TAKE ONE (1)  CAPSULE(S) ONCE DAILY 90 capsule 1    **APPOINTMENT DUE 10/2011**  . RANITIDINE HCL 150 MG PO CAPS Oral Take 1 capsule (150 mg total) by mouth 2 (two) times daily. 60 capsule 2  . TRAMADOL HCL 50 MG PO TABS Oral Take 1 tablet (50 mg total) by mouth every 6 (six) hours as needed for pain. 30 tablet 2    BP 122/67  Pulse 64  Temp 97.7 F (36.5 C) (Oral)  Resp 16  Ht 5\' 8"  (1.727 m)  Wt 150 lb (68.04 kg)  BMI 22.81 kg/m2  SpO2 96%  Physical Exam  Nursing note and vitals reviewed. Constitutional: He is oriented to person, place, and time.       Slender elderly man in no visible distress.  HENT:  Head: Normocephalic and atraumatic.  Right Ear: External ear normal.  Left Ear: External ear normal.  Mouth/Throat: Oropharynx is clear and moist.  Eyes: Conjunctivae normal and EOM are normal. Pupils are equal, round, and reactive to light.  Neck: Normal range of motion. Neck supple.  Cardiovascular: Normal rate, regular rhythm and normal heart sounds.   Pulmonary/Chest: Effort normal and breath sounds normal.  Abdominal:       He localizes the pain to the left lower abdomen. There is no mass or tenderness there. Bowel sounds are normal.  Musculoskeletal: Normal range of motion. He exhibits no edema and no tenderness.  Neurological: He is alert and oriented to person, place, and time.       No sensory or motor deficit.  Skin: Skin is warm and dry.  Psychiatric: He has a normal mood and affect. His behavior is normal.    ED Course  Procedures (including critical care time)  12:23 PM  Date: 01/05/2012  Rate: 66  Rhythm: normal sinus rhythm  QRS Axis: left  Intervals: normal QRS:  Poor R wave progression in precordial leads suggests old anterior myocardial infarction.  ST/T Wave abnormalities: ST elevations inferiorly and ST depressions laterally  Conduction Disutrbances:left anterior fascicular block  Narrative Interpretation: Abnormal EKG  Old EKG Reviewed: unchanged  Results  for orders placed during the hospital encounter of 01/05/12  COMPREHENSIVE METABOLIC PANEL      Component Value Range   Sodium 138  135 - 145 mEq/L   Potassium 4.3  3.5 - 5.1 mEq/L   Chloride 106  96 - 112 mEq/L   CO2 24  19 - 32 mEq/L   Glucose, Bld 99  70 - 99 mg/dL   BUN 17  6 - 23 mg/dL   Creatinine, Ser 1.61  0.50 - 1.35 mg/dL   Calcium 8.9  8.4 - 09.6 mg/dL   Total Protein 6.2  6.0 -  8.3 g/dL   Albumin 3.7  3.5 - 5.2 g/dL   AST 17  0 - 37 U/L   ALT 13  0 - 53 U/L   Alkaline Phosphatase 45  39 - 117 U/L   Total Bilirubin 0.7  0.3 - 1.2 mg/dL   GFR calc non Af Amer 62 (*) >90 mL/min   GFR calc Af Amer 72 (*) >90 mL/min  LIPASE, BLOOD      Component Value Range   Lipase 17  11 - 59 U/L  CBC WITH DIFFERENTIAL      Component Value Range   WBC 7.1  4.0 - 10.5 K/uL   RBC 3.23 (*) 4.22 - 5.81 MIL/uL   Hemoglobin 10.4 (*) 13.0 - 17.0 g/dL   HCT 81.1 (*) 91.4 - 78.2 %   MCV 95.4  78.0 - 100.0 fL   MCH 32.2  26.0 - 34.0 pg   MCHC 33.8  30.0 - 36.0 g/dL   RDW 95.6  21.3 - 08.6 %   Platelets 97 (*) 150 - 400 K/uL   Neutrophils Relative 66  43 - 77 %   Neutro Abs 4.7  1.7 - 7.7 K/uL   Lymphocytes Relative 19  12 - 46 %   Lymphs Abs 1.3  0.7 - 4.0 K/uL   Monocytes Relative 14 (*) 3 - 12 %   Monocytes Absolute 1.0  0.1 - 1.0 K/uL   Eosinophils Relative 1  0 - 5 %   Eosinophils Absolute 0.1  0.0 - 0.7 K/uL   Basophils Relative 0  0 - 1 %   Basophils Absolute 0.0  0.0 - 0.1 K/uL   RBC Morphology BASOPHILIC STIPPLING     Ct Abdomen Pelvis Wo Contrast  01/05/2012  *RADIOLOGY REPORT*  Clinical Data: Quadrant abdominal pain.  History prostate cancer and diverticulitis.  History renal calculi.  CT ABDOMEN AND PELVIS WITHOUT CONTRAST  Technique:  Multidetector CT imaging of the abdomen and pelvis was performed following the standard protocol without intravenous contrast.  Comparison: 07/06/2011  Findings: Dependent subsegmental atelectasis noted in both lower lobes with mild left lower  lobe scarring.  Stable noncontrast CT appearance of the liver and spleen. Gallbladder unremarkable.  Adrenal glands unremarkable.  10 cm left kidney lower pole cyst noted along with a separate 6 cm left kidney lower pole cyst, not changed from prior.  7 mm right kidney upper pole nonobstructive calculus, stable.  3 mm right mid kidney nonobstructive calculus, stable.  2 mm right kidney lower pole nonobstructive calculus, stable.  Stable right kidney lower pole cyst and small parapelvic cysts.  Stable exophytic lesion from the right kidney lower pole posteriorly.  Small hiatal hernia noted.  Contrast in the distal esophagus favors gastroesophageal reflux.  Noncontrast CT appearance of the pancreas unremarkable.  Stable dextroconvex lumbar scoliosis with aortoiliac atherosclerotic calcification.  Extensive diverticulosis of the descending and sigmoid colon is observed.  Appendix has been removed.  Urinary bladder unremarkable.  Right inguinal hernia contains adipose tissue.  Multilevel degenerative disc disease noted in the lumbar spine.  No specific signs of acute diverticulitis.  Orally administered contrast extends through to the colon.  IMPRESSION:  1.  Stable appearance of nonobstructive right renal calculi, large renal cysts, lumbar spondylosis, scoliosis, and degenerative disc disease. 2.  Stable descending and sigmoid prominent diverticulosis without active diverticulitis identified. 3.  Stable small hiatal hernia.  Probable gastroesophageal reflux. 4.  Coronary artery atherosclerosis.  5.  Right inguinal hernia contains adipose tissue.  Original Report Authenticated By: Dellia Cloud, M.D.     Pt's lab workup and CT x-rays showed diverticulosis of the colon without diverticulitis.  Where he has had a 4 day course of LLQ pain, with treat as though for diverticulits with Cipro/Flagyl.  Pt did not want any strong pain medicines, will go with Tylenol if needed for pain.  F/U with Dr. Marga Melnick,  his internist.    1. Diverticulosis of colon       Carleene Cooper III, MD 01/05/12 [phone]

## 2012-01-05 NOTE — ED Notes (Signed)
CT of Abd/pelvis changed to W/o , Pt allergic to iodine.  Pt given 500 ml cup of non ionic contrast at 1315, 2nd cup of contrast to be given at 1415 and initial scan to be done at 1515.  Sherrine Maples

## 2012-01-05 NOTE — ED Notes (Addendum)
Left abd pain, describes mid-abd. Hx of diverticulitis

## 2012-01-09 ENCOUNTER — Other Ambulatory Visit: Payer: Self-pay | Admitting: Internal Medicine

## 2012-01-09 NOTE — Telephone Encounter (Signed)
Refills x  4 --- last ov 3.28.13  1-DITROPAN-XL 10 MG Take one tablet by mouth daily #30 last fill 9.5.13  2-PLAVIX 75 MG Take one tablet by mouth daily.  #30 last fill 9.5.13  3-Ramipril (Cap) 10 MG TAKE ONE  CAPSULE BY MOUTH ONCE DAILY #30 last fill 9.12.13  4-Metoprolol Succinate  25 MG Take one tablet by mouth twice daily #60 last fill 9.5.13

## 2012-01-10 ENCOUNTER — Other Ambulatory Visit: Payer: Self-pay | Admitting: Internal Medicine

## 2012-01-10 NOTE — Telephone Encounter (Signed)
Per Dr.Hopper patient to be seen prior to refills. I called patient and he scheduled appointment for Monday 01/14/12, patient assured me that he will not run out of medications prior to appointment for patient refused appointment for tomorrow (Friday 01/11/12)

## 2012-01-14 ENCOUNTER — Ambulatory Visit (INDEPENDENT_AMBULATORY_CARE_PROVIDER_SITE_OTHER): Payer: Medicare Other | Admitting: Internal Medicine

## 2012-01-14 ENCOUNTER — Encounter: Payer: Self-pay | Admitting: Internal Medicine

## 2012-01-14 ENCOUNTER — Other Ambulatory Visit: Payer: Self-pay | Admitting: Internal Medicine

## 2012-01-14 VITALS — BP 136/78 | HR 76 | Wt 150.6 lb

## 2012-01-14 DIAGNOSIS — K5732 Diverticulitis of large intestine without perforation or abscess without bleeding: Secondary | ICD-10-CM

## 2012-01-14 DIAGNOSIS — K5792 Diverticulitis of intestine, part unspecified, without perforation or abscess without bleeding: Secondary | ICD-10-CM

## 2012-01-14 DIAGNOSIS — D510 Vitamin B12 deficiency anemia due to intrinsic factor deficiency: Secondary | ICD-10-CM

## 2012-01-14 NOTE — Patient Instructions (Addendum)
Share results with Nurses & Dr Lowell Guitar. It would be in your best interest and provide continuity of care for Dr. Lowell Guitar to assume primary care for you

## 2012-01-14 NOTE — Telephone Encounter (Signed)
Please have the pharmacy veriy with the nursing facility who is the prescribing physician on each medication. Dr Lowell Guitar will be assuming primary care management .The patient has no idea what medications he is taking. He does not know who prescribed the Plavix. I assume his urologist, Dr. Patsi Sears, prescribed the oxybutynin

## 2012-01-14 NOTE — Progress Notes (Signed)
  Subjective:    Patient ID: Jordan Little, male    DOB: [birth year], 78 y.o.   MRN: [id number]  HPI He was seen at the [facility] ER 01/05/12 for acute diverticulitis. He was prescribed metronidazole and Cipro. Lab reports were reviewed. Chemistries were normal; white count was 7100 with a normal differential. His hematocrit was 30.8 down from 36.6 in April this year. He has a diagnosis of pernicious anemia;but he is not receiving B12 shots at the retirement facility.    Review of Systems His medications are being managed by the nursing staff at the retirement facility. He states that he gets 10 pills at 7:30 in the morning and 3 pills at 7:30 at night; he is unable to give me the names or reason for taking his medications. He is on generic Plavix; he is unaware of who prescribed this. There is an attending at the nursing facility, Dr. Lowell Guitar. The nursing staff has recommended Dr. Lowell Guitar assume his primary care. This certainly is appropriate     Objective:   Physical Exam General appearance : using rolling walker, but in  good health  w/o distress.  Eyes: No conjunctival inflammation or scleral icterus is present.  Oral exam: Dental hygiene is good; lips and gums are healthy appearing.There is no oropharyngeal erythema or exudate noted.   Heart:  Normal rate and regular rhythm. Accentuated S1 .No gallop,  click, rub or other extra sounds  .Grade 1/2 over 6 R basilar systolic murmur    Lungs:Chest clear to auscultation; no wheezes, rhonchi,rales ,or rubs present.No increased work of breathing. BS decreased  Abdomen: bowel sounds normal, soft and non-tender without masses, organomegaly or hernias noted.  No guarding or rebound   Skin:Warm & dry.  Intact without suspicious lesions or rashes ; no jaundice or tenting  Lymphatic: No lymphadenopathy is noted about the head, neck, axilla areas.   Neuro: Oriented x3  Musculoskeletal: Scoliosis of the thoracic spine. He requires  minimal help getting onto the exam table. He is able to lie flat and sit up with minimal aid             Assessment & Plan:  #1 diverticulitis, clinically resolved  #2 pernicious anemia, questionable B12 status  #3 medical management by the nursing staff at the retirement facility; he does not know his meds. His direct primary care should be assumed by Dr. Lowell Guitar to facilitate continuity of care. He was given a copy of the EMR medication list for reconciliation at the retirement facility

## 2012-01-14 NOTE — Telephone Encounter (Signed)
Dr.Hopper please advise, patient was seen today. None of the requested medications show being rx'ed by you

## 2012-01-15 ENCOUNTER — Telehealth: Payer: Self-pay | Admitting: Internal Medicine

## 2012-01-15 LAB — CBC WITH DIFFERENTIAL/PLATELET
Basophils Relative: 0.4 % (ref 0.0–3.0)
Eosinophils Relative: 3.5 % (ref 0.0–5.0)
Lymphocytes Relative: 28.5 % (ref 12.0–46.0)
MCV: 97.5 fl (ref 78.0–100.0)
Monocytes Relative: 9.4 % (ref 3.0–12.0)
Neutrophils Relative %: 58.2 % (ref 43.0–77.0)
RBC: 3.52 Mil/uL — ABNORMAL LOW (ref 4.22–5.81)
WBC: 5.2 10*3/uL (ref 4.5–10.5)

## 2012-01-15 NOTE — Telephone Encounter (Signed)
I called the pharmacy x 3 and no answer. I will respond with electronic message

## 2012-01-15 NOTE — Telephone Encounter (Signed)
LM ON TRIAGE LINE 10.14.13 454PM--Needs refills sent to [facility] ph# [phone] Per voicemail from Berlin Rigero @ [employer] needs refills to fill patient medication box for the week for the following which she stated she called into [facility] already but we have not replied  1-Plavix 2-Toprol 3-Ditropan 4-Ramipril NOTE Ramipril shows sent 10.14.13

## 2012-01-16 ENCOUNTER — Other Ambulatory Visit: Payer: Self-pay | Admitting: Internal Medicine

## 2012-01-16 NOTE — Telephone Encounter (Signed)
I called patient and asked for number to contact Debra. Per Dr.Hopper refill medications for #30 no refills, patient to establish care with MD at assisted living facility.

## 2012-01-16 NOTE — Telephone Encounter (Signed)
I called the nursing home and left a message for Gavin Pound the nurse who  works with Mr. Kwiatkowski that I would refill his medicines for 30 days until Dr. Lowell Guitar can assume primary care. I informed her that I had not prescribed the Plavix or the Ditropan, but I  would refill these to allow continuity of care until the change in doctors can be completed.

## 2012-01-16 NOTE — Telephone Encounter (Signed)
Dr.Hopper left message on VM [phone]) for Stanton Kidney informing her additional refills to come from MD at the facility.

## 2012-01-17 NOTE — Telephone Encounter (Signed)
Medications have been faxed.      KP 

## 2012-11-05 ENCOUNTER — Other Ambulatory Visit: Payer: Self-pay

## 2013-02-05 ENCOUNTER — Other Ambulatory Visit: Payer: Self-pay

## 2014-04-02 HISTORY — PX: FRACTURE SURGERY: SHX138

## 2014-04-21 ENCOUNTER — Emergency Department (HOSPITAL_COMMUNITY): Payer: Medicare PPO

## 2014-04-21 ENCOUNTER — Inpatient Hospital Stay (HOSPITAL_COMMUNITY)
Admission: EM | Admit: 2014-04-21 | Discharge: 2014-04-26 | DRG: 481 | Disposition: A | Discharge status: Skilled Nursing Facility | Payer: Medicare PPO | Attending: Internal Medicine | Admitting: Internal Medicine

## 2014-04-21 DIAGNOSIS — I10 Essential (primary) hypertension: Secondary | ICD-10-CM | POA: Diagnosis present

## 2014-04-21 DIAGNOSIS — R778 Other specified abnormalities of plasma proteins: Secondary | ICD-10-CM | POA: Diagnosis present

## 2014-04-21 DIAGNOSIS — I248 Other forms of acute ischemic heart disease: Secondary | ICD-10-CM | POA: Diagnosis present

## 2014-04-21 DIAGNOSIS — S72002A Fracture of unspecified part of neck of left femur, initial encounter for closed fracture: Secondary | ICD-10-CM | POA: Diagnosis present

## 2014-04-21 DIAGNOSIS — Z7982 Long term (current) use of aspirin: Secondary | ICD-10-CM

## 2014-04-21 DIAGNOSIS — R339 Retention of urine, unspecified: Secondary | ICD-10-CM | POA: Diagnosis not present

## 2014-04-21 DIAGNOSIS — Z8546 Personal history of malignant neoplasm of prostate: Secondary | ICD-10-CM

## 2014-04-21 DIAGNOSIS — D510 Vitamin B12 deficiency anemia due to intrinsic factor deficiency: Secondary | ICD-10-CM | POA: Diagnosis present

## 2014-04-21 DIAGNOSIS — Z66 Do not resuscitate: Secondary | ICD-10-CM | POA: Diagnosis present

## 2014-04-21 DIAGNOSIS — Z87891 Personal history of nicotine dependence: Secondary | ICD-10-CM

## 2014-04-21 DIAGNOSIS — S72142A Displaced intertrochanteric fracture of left femur, initial encounter for closed fracture: Principal | ICD-10-CM | POA: Diagnosis present

## 2014-04-21 DIAGNOSIS — Z955 Presence of coronary angioplasty implant and graft: Secondary | ICD-10-CM

## 2014-04-21 DIAGNOSIS — W19XXXA Unspecified fall, initial encounter: Secondary | ICD-10-CM | POA: Insufficient documentation

## 2014-04-21 DIAGNOSIS — Z419 Encounter for procedure for purposes other than remedying health state, unspecified: Secondary | ICD-10-CM

## 2014-04-21 DIAGNOSIS — I2510 Atherosclerotic heart disease of native coronary artery without angina pectoris: Secondary | ICD-10-CM | POA: Diagnosis present

## 2014-04-21 DIAGNOSIS — R0602 Shortness of breath: Secondary | ICD-10-CM | POA: Insufficient documentation

## 2014-04-21 DIAGNOSIS — R7989 Other specified abnormal findings of blood chemistry: Secondary | ICD-10-CM | POA: Diagnosis present

## 2014-04-21 DIAGNOSIS — E785 Hyperlipidemia, unspecified: Secondary | ICD-10-CM | POA: Diagnosis present

## 2014-04-21 DIAGNOSIS — F0390 Unspecified dementia without behavioral disturbance: Secondary | ICD-10-CM | POA: Diagnosis present

## 2014-04-21 DIAGNOSIS — M25552 Pain in left hip: Secondary | ICD-10-CM | POA: Diagnosis not present

## 2014-04-21 HISTORY — DX: Vitamin B12 deficiency anemia due to intrinsic factor deficiency: D51.0

## 2014-04-21 LAB — PROTIME-INR
INR: 0.98 (ref 0.00–1.49)
PROTHROMBIN TIME: 13.1 s (ref 11.6–15.2)

## 2014-04-21 NOTE — ED Notes (Signed)
Dr. Lita Mains at the bedside. Discussed telemetry strips with EMS, and confusion after 100 mcg of fentanyl with EMS. MD acknowledges, will allow 92mcg of fentanyl for pain management if necessary.

## 2014-04-21 NOTE — ED Provider Notes (Signed)
CSN: [id number]     Arrival date & time 04/21/14  2304 History  This chart was scribed for Julianne Rice, MD by Delphia Grates, ED Scribe. This patient was seen in room D34C/D34C and the patient's care was started at 11:19 PM.    Chief Complaint  Patient presents with  . Fall     Patient is a 81 y.o. male presenting with fall. The history is provided by the patient. No language interpreter was used.  Fall This is a recurrent problem. The current episode started 3 to 5 hours ago. The problem has not changed since onset.Pertinent negatives include no chest pain, no abdominal pain and no headaches. Nothing aggravates the symptoms. Nothing relieves the symptoms. He has tried nothing for the symptoms. The treatment provided no relief.     HPI Comments: Jordan Little is a 81 y.o. male who presents to the Emergency Department complaining of a fall the occurred approximately 4 hours ago at [place]. Per son, the patient is from [facility] and was in the bathroom when he fell onto a tile floor, landing on his left hip. Patient is unsure the mechanism of the fall or head injury. Son notes the patient ambulates with a walker, but is unsure if the patient was utilizing it at the time of the fall. There is associated "intense" left hip pain and abrasions to the left elbow. Per EMS, patient received X-rays 3 hours ago at 2030 which revealed positive findings for left hip fracture. Son notes this is the patient's second fall. Son reports the patient is medicated (fentanyl) at this time, and notes this is affecting his baseline behavior. Patient does have baseline dementia.   Past Medical History  Diagnosis Date  . Prostate cancer     [facility], Dr. Gaynelle Arabian  . CAD (coronary artery disease)     Dr Gwenlyn Found  . Hyperlipidemia   . Hypertension   . Diverticulosis 2010    with colon bleed  . Personal history of renal calculi 2007  . Pneumonia 2007  . Diverticulitis 01/05/2012    [facility]  . Pernicious anemia    Past Surgical History  Procedure Laterality Date  . Coronary angioplasty with stent placement  1999    Dr. Gwenlyn Found, 4 vessels  . Transurethral resection of prostate      Dr.   Marla Roe  . Prostate surgery      Dr. Gaynelle Arabian  . Laparoscopic appendectomy  07/06/2011    Procedure: APPENDECTOMY LAPAROSCOPIC;  Surgeon: Adin Hector, MD;  Location: [facility];  Service: General;  Laterality: N/A;   Family History  Problem Relation Age of Onset  . Cancer Mother   . Cancer Father     prostate   History  Substance Use Topics  . Smoking status: Former Research scientist (life sciences)  . Smokeless tobacco: Former Systems developer    Quit date: 07/17/1971  . Alcohol Use: No    Review of Systems  Cardiovascular: Negative for chest pain.  Gastrointestinal: Negative for nausea, vomiting and abdominal pain.  Musculoskeletal: Positive for arthralgias. Negative for back pain, neck pain and neck stiffness.  Skin: Positive for wound. Negative for rash.  Neurological: Negative for dizziness, weakness, numbness and headaches.  All other systems reviewed and are negative.     Allergies  Iodine; Dilaudid; and Fentanyl  Home Medications   Prior to Admission medications   Medication Sig Start Date End Date Taking? Authorizing Provider  acetaminophen (TYLENOL) 500 MG tablet Take 500  mg by mouth 3 (three) times daily.   Yes Historical Provider, MD  aspirin 81 MG tablet Take 81 mg by mouth daily.     Yes Historical Provider, MD  ergocalciferol (VITAMIN D2) 50000 UNITS capsule Take 50,000 Units by mouth once a week.   Yes Historical Provider, MD  glucosamine-chondroitin 500-400 MG tablet Take 2 tablets by mouth daily.    Yes Historical Provider, MD  iron polysaccharides (NIFEREX) 150 MG capsule Take 150 mg by mouth daily.   Yes Historical Provider, MD  isosorbide mononitrate (IMDUR) 60 MG 24 hr tablet Take 60 mg by mouth daily.     Yes Historical Provider, MD  lisinopril (PRINIVIL,ZESTRIL) 10 MG  tablet Take 10 mg by mouth 2 (two) times daily.   Yes Historical Provider, MD  Menthol, Topical Analgesic, 4 % GEL Apply 1 inch topically daily.   Yes Historical Provider, MD  metoprolol succinate (TOPROL-XL) 50 MG 24 hr tablet Take 50 mg by mouth daily. Take with or immediately following a meal.   Yes Historical Provider, MD  oxybutynin (DITROPAN-XL) 10 MG 24 hr tablet TAKE ONE TABLET BY MOUTH ONE TIME DAILY 01/16/12  Yes Hendricks Limes, MD  pantoprazole (PROTONIX) 40 MG tablet Take 40 mg by mouth daily.   Yes Historical Provider, MD  polyethylene glycol (MIRALAX / GLYCOLAX) packet Take 17 g by mouth daily as needed for mild constipation.   Yes Historical Provider, MD  traMADol (ULTRAM) 50 MG tablet Take 1 tablet (50 mg total) by mouth every 6 (six) hours as needed for pain. 07/18/10  Yes Hendricks Limes, MD  vitamin B-12 (CYANOCOBALAMIN) 1000 MCG tablet Take 1,000 mcg by mouth daily.   Yes Historical Provider, MD   Triage Vitals: BP 169/99 mmHg  Pulse 80  Temp(Src) 97.6 F (36.4 C)  Resp 18  SpO2 99%  Physical Exam  Constitutional: He is oriented to person, place, and time. He appears well-developed and well-nourished. No distress.  HENT:  Head: Normocephalic and atraumatic.  Mouth/Throat: Oropharynx is clear and moist. No oropharyngeal exudate.  Eyes: Conjunctivae and EOM are normal. Pupils are equal, round, and reactive to light.  Neck: Normal range of motion. Neck supple. No tracheal deviation present.  No midline posterior cervical spine tenderness to palpation.  Cardiovascular: Normal rate and regular rhythm.   Pulmonary/Chest: Effort normal and breath sounds normal. No respiratory distress. He has no wheezes. He has no rales. He exhibits no tenderness.  Abdominal: Soft. Bowel sounds are normal. He exhibits no distension and no mass. There is no tenderness. There is no rebound and no guarding.  Musculoskeletal: He exhibits no edema or tenderness.  Decreased range of motion of the  left hip. Tenderness with palpation over the lateral left hip. Dorsalis pedis pulses 2+ bilaterally. No tenderness to palpation of the left knee or ankle.  Neurological: He is alert and oriented to person, place, and time.  Sensation is fully intact. 5/5 motor in all extremities though with limited movement of left lower extremity due to pain.  Skin: Skin is warm and dry. No rash noted. No erythema.  Small abrasion to the left elbow. No underlying effusion or deformity. Full range of motion of left elbow. No focal tenderness.  Psychiatric: He has a normal mood and affect. His behavior is normal.  Nursing note and vitals reviewed.   ED Course  Procedures (including critical care time)  DIAGNOSTIC STUDIES: Oxygen Saturation is 99% on room air, normal by my interpretation.    COORDINATION  OF CARE: At 2324 Discussed treatment plan with patient. Patient agrees.   Labs Review Labs Reviewed  CBC WITH DIFFERENTIAL - Abnormal; Notable for the following:    RBC 3.75 (*)    Hemoglobin 10.1 (*)    HCT 31.6 (*)    RDW 19.3 (*)    Platelets 113 (*)    Neutrophils Relative % 78 (*)    All other components within normal limits  COMPREHENSIVE METABOLIC PANEL - Abnormal; Notable for the following:    Glucose, Bld 167 (*)    GFR calc non Af Amer 46 (*)    GFR calc Af Amer 54 (*)    All other components within normal limits  TROPONIN I - Abnormal; Notable for the following:    Troponin I 0.49 (*)    All other components within normal limits  URINALYSIS, ROUTINE W REFLEX MICROSCOPIC - Abnormal; Notable for the following:    APPearance CLOUDY (*)    Leukocytes, UA SMALL (*)    All other components within normal limits  URINE MICROSCOPIC-ADD ON - Abnormal; Notable for the following:    Casts HYALINE CASTS (*)    All other components within normal limits  PROTIME-INR  TROPONIN I  TROPONIN I  TROPONIN I    Imaging Review Dg Chest 1 View  04/22/2014   CLINICAL DATA:  Fall with left hip pain.   EXAM: CHEST - 1 VIEW  COMPARISON:  05/19/2009  FINDINGS: Cardiac silhouette top-normal in size. Aorta is uncoiled. No mediastinal or hilar masses. Lungs are clear. No pleural effusion or pneumothorax.  Bony thorax is diffusely demineralized but grossly intact.  IMPRESSION: No acute cardiopulmonary disease.   Electronically Signed   By: Lajean Manes M.D.   On: 04/22/2014 00:25   Ct Head Wo Contrast  04/22/2014   CLINICAL DATA:  Per EMS, the patient is from [facility] in [place], and fell tonight trying to use the restroom. He typically uses a walker, the son reports. Fall was around 7:50 tonight, with confirmed x-ray of left hip at the facility at 2030, not present with paperwork on arrival. 100 mcg of fentanyl given by ems, for pain management, side effects of confusion and a-fib with abberancy on ekg.  EXAM: CT HEAD WITHOUT CONTRAST  TECHNIQUE: Contiguous axial images were obtained from the base of the skull through the vertex without intravenous contrast.  COMPARISON:  12/13/2013  FINDINGS: Ventricles normal configuration. There is ventricular and sulcal enlargement reflecting a moderate atrophy. No hydrocephalus.  No parenchymal masses or mass effect. There is no evidence of a a cortical infarct.  Patchy areas of white matter hypoattenuation are noted, stable, consistent with moderate chronic microvascular ischemic change.  No extra-axial masses or abnormal fluid collections.  There is no intracranial hemorrhage.  Fluid level in the right sphenoid sinus, stable from the prior exam. Remaining sinuses are clear as are the mastoid air cells. No skull fracture.  IMPRESSION: 1. No acute intracranial abnormalities. 2. Atrophy and chronic microvascular ischemic change, stable.   Electronically Signed   By: Lajean Manes M.D.   On: 04/22/2014 00:09   Dg Hip Unilat With Pelvis 2-3 Views Left  04/21/2014   CLINICAL DATA:  Fall tonight.  Left hip pain.  EXAM: DG HIP W/ PELVIS 2-3V*L*  COMPARISON:  None.  FINDINGS:  There is an intertrochanteric fracture of the proximal left femur. There are greater and lesser trochanteric fracture fragments. The primary fracture components are mildly displaced, 2.4 cm, with the inferior neck  fracture component overlapped on the metaphyseal component. There is varus angulation.  No other fractures. No dislocation. Bones are extensively demineralized. There are dense calcifications along the femoral vessels.  IMPRESSION: Mildly displaced intertrochanteric comminuted left proximal femur fracture with varus angulation.   Electronically Signed   By: Lajean Manes M.D.   On: 04/21/2014 23:59     EKG Interpretation   Date/Time:  Thursday April 22 2014 00:05:56 EST Ventricular Rate:  63 PR Interval:  155 QRS Duration: 113 QT Interval:  423 QTC Calculation: 433 R Axis:   -33 Text Interpretation:  Sinus rhythm Borderline IVCD with LAD Inferior  infarct, old Lateral leads are also involved Confirmed by Lita Mains  MD,  Jerzee Jerome ([id number]) on 04/22/2014 12:35:05 AM      MDM   Final diagnoses:  Fall  Closed left hip fracture, initial encounter  Elevated troponin   I personally performed the services described in this documentation, which was scribed in my presence. The recorded information has been reviewed and is accurate.   Discussed with Dr. Fabio Neighbors. Will admit to telemetry bed. Discussed with Dr. Berenice Primas (orthopedics). Will see the patient in the morning. Discussed with Dr. Aundra Dubin (cardiology). Advises cycling of troponin and reconsultation in the morning.  Julianne Rice, MD 04/22/14 [phone]

## 2014-04-21 NOTE — ED Notes (Signed)
Patient still off the unit for testing 

## 2014-04-21 NOTE — ED Notes (Signed)
Per EMS, the patient is from [facility] in [place], and fell tonight trying to use the restroom.  He typically uses a walker, the son reports.  Fall was around 7:50 tonight, with confirmed x-ray of left hip at the facility at 2030, not present with paperwork on arrival. 100 mcg of fentanyl given by ems, for pain management, side effects of confusion and a-fib with abberancy on ekg.

## 2014-04-22 ENCOUNTER — Emergency Department (HOSPITAL_COMMUNITY): Payer: Medicare PPO

## 2014-04-22 ENCOUNTER — Encounter (HOSPITAL_COMMUNITY): Payer: Self-pay | Admitting: Emergency Medicine

## 2014-04-22 ENCOUNTER — Inpatient Hospital Stay (HOSPITAL_COMMUNITY): Payer: Medicare PPO

## 2014-04-22 DIAGNOSIS — Z8546 Personal history of malignant neoplasm of prostate: Secondary | ICD-10-CM | POA: Diagnosis not present

## 2014-04-22 DIAGNOSIS — D510 Vitamin B12 deficiency anemia due to intrinsic factor deficiency: Secondary | ICD-10-CM | POA: Diagnosis not present

## 2014-04-22 DIAGNOSIS — Z66 Do not resuscitate: Secondary | ICD-10-CM | POA: Diagnosis present

## 2014-04-22 DIAGNOSIS — R778 Other specified abnormalities of plasma proteins: Secondary | ICD-10-CM | POA: Diagnosis present

## 2014-04-22 DIAGNOSIS — S72142A Displaced intertrochanteric fracture of left femur, initial encounter for closed fracture: Secondary | ICD-10-CM | POA: Diagnosis not present

## 2014-04-22 DIAGNOSIS — Z7982 Long term (current) use of aspirin: Secondary | ICD-10-CM | POA: Diagnosis not present

## 2014-04-22 DIAGNOSIS — F0390 Unspecified dementia without behavioral disturbance: Secondary | ICD-10-CM | POA: Diagnosis present

## 2014-04-22 DIAGNOSIS — Z01810 Encounter for preprocedural cardiovascular examination: Secondary | ICD-10-CM

## 2014-04-22 DIAGNOSIS — R7989 Other specified abnormal findings of blood chemistry: Secondary | ICD-10-CM | POA: Diagnosis present

## 2014-04-22 DIAGNOSIS — M25552 Pain in left hip: Secondary | ICD-10-CM | POA: Diagnosis present

## 2014-04-22 DIAGNOSIS — W19XXXA Unspecified fall, initial encounter: Secondary | ICD-10-CM | POA: Diagnosis present

## 2014-04-22 DIAGNOSIS — E785 Hyperlipidemia, unspecified: Secondary | ICD-10-CM | POA: Diagnosis present

## 2014-04-22 DIAGNOSIS — I2510 Atherosclerotic heart disease of native coronary artery without angina pectoris: Secondary | ICD-10-CM | POA: Diagnosis present

## 2014-04-22 DIAGNOSIS — R339 Retention of urine, unspecified: Secondary | ICD-10-CM | POA: Diagnosis not present

## 2014-04-22 DIAGNOSIS — S72002A Fracture of unspecified part of neck of left femur, initial encounter for closed fracture: Secondary | ICD-10-CM | POA: Diagnosis present

## 2014-04-22 DIAGNOSIS — Z87891 Personal history of nicotine dependence: Secondary | ICD-10-CM | POA: Diagnosis not present

## 2014-04-22 DIAGNOSIS — Z955 Presence of coronary angioplasty implant and graft: Secondary | ICD-10-CM | POA: Diagnosis not present

## 2014-04-22 DIAGNOSIS — I10 Essential (primary) hypertension: Secondary | ICD-10-CM | POA: Diagnosis present

## 2014-04-22 DIAGNOSIS — I248 Other forms of acute ischemic heart disease: Secondary | ICD-10-CM | POA: Diagnosis not present

## 2014-04-22 LAB — COMPREHENSIVE METABOLIC PANEL
ALK PHOS: 49 U/L (ref 39–117)
ALT: 13 U/L (ref 0–53)
ANION GAP: 8 (ref 5–15)
AST: 26 U/L (ref 0–37)
Albumin: 3.6 g/dL (ref 3.5–5.2)
BILIRUBIN TOTAL: 0.7 mg/dL (ref 0.3–1.2)
BUN: 20 mg/dL (ref 6–23)
CHLORIDE: 106 meq/L (ref 96–112)
CO2: 23 mmol/L (ref 19–32)
Calcium: 9 mg/dL (ref 8.4–10.5)
Creatinine, Ser: 1.26 mg/dL (ref 0.50–1.35)
GFR calc non Af Amer: 46 mL/min — ABNORMAL LOW (ref >90)
GFR, EST AFRICAN AMERICAN: 54 mL/min — AB (ref >90)
GLUCOSE: 167 mg/dL — AB (ref 70–99)
POTASSIUM: 3.8 mmol/L (ref 3.5–5.1)
Sodium: 137 mmol/L (ref 135–145)
Total Protein: 6.3 g/dL (ref 6.0–8.3)

## 2014-04-22 LAB — CBC WITH DIFFERENTIAL/PLATELET
Basophils Absolute: 0 10*3/uL (ref 0.0–0.1)
Basophils Relative: 0 % (ref 0–1)
Eosinophils Absolute: 0 10*3/uL (ref 0.0–0.7)
Eosinophils Relative: 0 % (ref 0–5)
HEMATOCRIT: 31.6 % — AB (ref 39.0–52.0)
Hemoglobin: 10.1 g/dL — ABNORMAL LOW (ref 13.0–17.0)
LYMPHS ABS: 1.3 10*3/uL (ref 0.7–4.0)
LYMPHS PCT: 14 % (ref 12–46)
MCH: 26.9 pg (ref 26.0–34.0)
MCHC: 32 g/dL (ref 30.0–36.0)
MCV: 84.3 fL (ref 78.0–100.0)
MONOS PCT: 8 % (ref 3–12)
Monocytes Absolute: 0.8 10*3/uL (ref 0.1–1.0)
NEUTROS ABS: 7.4 10*3/uL (ref 1.7–7.7)
Neutrophils Relative %: 78 % — ABNORMAL HIGH (ref 43–77)
PLATELETS: 113 10*3/uL — AB (ref 150–400)
RBC: 3.75 MIL/uL — AB (ref 4.22–5.81)
RDW: 19.3 % — AB (ref 11.5–15.5)
WBC: 9.4 10*3/uL (ref 4.0–10.5)

## 2014-04-22 LAB — URINALYSIS, ROUTINE W REFLEX MICROSCOPIC
BILIRUBIN URINE: NEGATIVE
Glucose, UA: NEGATIVE mg/dL
Hgb urine dipstick: NEGATIVE
KETONES UR: NEGATIVE mg/dL
NITRITE: NEGATIVE
Protein, ur: NEGATIVE mg/dL
SPECIFIC GRAVITY, URINE: 1.022 (ref 1.005–1.030)
UROBILINOGEN UA: 0.2 mg/dL (ref 0.0–1.0)
pH: 5 (ref 5.0–8.0)

## 2014-04-22 LAB — TROPONIN I
TROPONIN I: 0.48 ng/mL — AB (ref <0.031)
TROPONIN I: 0.32 ng/mL — AB (ref <0.031)
TROPONIN I: 0.33 ng/mL — AB (ref <0.031)
Troponin I: 0.49 ng/mL — ABNORMAL HIGH (ref <0.031)

## 2014-04-22 LAB — URINE MICROSCOPIC-ADD ON

## 2014-04-22 LAB — SURGICAL PCR SCREEN
MRSA, PCR: NEGATIVE
Staphylococcus aureus: NEGATIVE

## 2014-04-22 MED ORDER — ISOSORBIDE MONONITRATE ER 60 MG PO TB24
60.0000 mg | ORAL_TABLET | Freq: Every day | ORAL | Status: DC
  Administered 2014-04-22 – 2014-04-26 (×5): 60 mg via ORAL
  Filled 2014-04-22 (×6): qty 1

## 2014-04-22 MED ORDER — POLYETHYLENE GLYCOL 3350 17 G PO PACK
17.0000 g | PACK | Freq: Every day | ORAL | Status: DC | PRN
  Filled 2014-04-22: qty 1

## 2014-04-22 MED ORDER — VITAMIN B-12 1000 MCG PO TABS
1000.0000 ug | ORAL_TABLET | Freq: Every day | ORAL | Status: DC
  Administered 2014-04-22 – 2014-04-26 (×5): 1000 ug via ORAL
  Filled 2014-04-22 (×6): qty 1

## 2014-04-22 MED ORDER — ASPIRIN EC 325 MG PO TBEC
325.0000 mg | DELAYED_RELEASE_TABLET | Freq: Every day | ORAL | Status: DC
  Administered 2014-04-23 – 2014-04-26 (×4): 325 mg via ORAL
  Filled 2014-04-22 (×6): qty 1

## 2014-04-22 MED ORDER — POLYSACCHARIDE IRON COMPLEX 150 MG PO CAPS
150.0000 mg | ORAL_CAPSULE | Freq: Every day | ORAL | Status: DC
  Administered 2014-04-22 – 2014-04-26 (×5): 150 mg via ORAL
  Filled 2014-04-22 (×6): qty 1

## 2014-04-22 MED ORDER — OXYBUTYNIN CHLORIDE ER 10 MG PO TB24
10.0000 mg | ORAL_TABLET | Freq: Every day | ORAL | Status: DC
  Administered 2014-04-22 – 2014-04-24 (×3): 10 mg via ORAL
  Filled 2014-04-22 (×5): qty 1

## 2014-04-22 MED ORDER — HYDROCODONE-ACETAMINOPHEN 5-325 MG PO TABS
1.0000 | ORAL_TABLET | Freq: Four times a day (QID) | ORAL | Status: DC | PRN
  Administered 2014-04-22 (×3): 1 via ORAL
  Administered 2014-04-24 – 2014-04-25 (×2): 2 via ORAL
  Filled 2014-04-22 (×3): qty 1
  Filled 2014-04-22 (×2): qty 2
  Filled 2014-04-22: qty 1

## 2014-04-22 MED ORDER — ACETAMINOPHEN 500 MG PO TABS
500.0000 mg | ORAL_TABLET | Freq: Three times a day (TID) | ORAL | Status: DC
  Administered 2014-04-22 – 2014-04-26 (×11): 500 mg via ORAL
  Filled 2014-04-22 (×17): qty 1

## 2014-04-22 MED ORDER — SODIUM CHLORIDE 0.9 % IV SOLN
INTRAVENOUS | Status: DC
  Administered 2014-04-22 – 2014-04-24 (×4): via INTRAVENOUS

## 2014-04-22 MED ORDER — METOPROLOL TARTRATE 25 MG PO TABS
25.0000 mg | ORAL_TABLET | Freq: Four times a day (QID) | ORAL | Status: DC
  Administered 2014-04-22 – 2014-04-24 (×5): 25 mg via ORAL
  Filled 2014-04-22 (×11): qty 1

## 2014-04-22 MED ORDER — MORPHINE SULFATE 2 MG/ML IJ SOLN
0.5000 mg | INTRAMUSCULAR | Status: DC | PRN
  Administered 2014-04-23 – 2014-04-24 (×2): 0.5 mg via INTRAVENOUS
  Filled 2014-04-22 (×2): qty 1

## 2014-04-22 MED ORDER — ENSURE COMPLETE PO LIQD
237.0000 mL | Freq: Two times a day (BID) | ORAL | Status: DC
  Administered 2014-04-22 – 2014-04-26 (×5): 237 mL via ORAL

## 2014-04-22 MED ORDER — METOPROLOL TARTRATE 1 MG/ML IV SOLN
5.0000 mg | Freq: Once | INTRAVENOUS | Status: AC
  Administered 2014-04-22: 5 mg via INTRAVENOUS
  Filled 2014-04-22: qty 5

## 2014-04-22 MED ORDER — METOPROLOL SUCCINATE ER 50 MG PO TB24
50.0000 mg | ORAL_TABLET | Freq: Every day | ORAL | Status: DC
  Administered 2014-04-22 – 2014-04-26 (×5): 50 mg via ORAL
  Filled 2014-04-22 (×6): qty 1

## 2014-04-22 MED ORDER — PANTOPRAZOLE SODIUM 40 MG PO TBEC
40.0000 mg | DELAYED_RELEASE_TABLET | Freq: Every day | ORAL | Status: DC
  Administered 2014-04-22 – 2014-04-26 (×5): 40 mg via ORAL
  Filled 2014-04-22 (×5): qty 1

## 2014-04-22 MED ORDER — LISINOPRIL 10 MG PO TABS
10.0000 mg | ORAL_TABLET | Freq: Two times a day (BID) | ORAL | Status: DC
  Administered 2014-04-22 – 2014-04-26 (×10): 10 mg via ORAL
  Filled 2014-04-22 (×12): qty 1

## 2014-04-22 NOTE — Progress Notes (Signed)
[facility] PROGRESS NOTE   Jordan Little UDT:[id number] DOB: Mar 09, 1916 DOA: 04/21/2014 PCP: [name], MD  HPI: Jordan Little is a 81 y.o. male with history of CAD, hyperlipidemia, hypertension, and dementia who suffered a mechanical fall 3-5 hours ago in his bathroom at his NH. Landed on L hip. Now has L hip pain, inability to bear weight. Pain is worse with movement, better a rest. Pain is severe. X ray confirms fracture.  Subjective: I was unable to get a reliable history from the patient. He reports he did not sleep well last night. His left hip hurts, and he has lower abdominal discomfort. He reports feeling very hungry.   Assessment/Plan: Principal Problem:   Closed left hip fracture Active Problems:   Essential hypertension   Elevated troponin   Dementia without behavioral disturbance   Closed Left Hip Fracture -Unattended fall in nursing home bathroom 04/21/2014, unclear etiology -Considering cardiac etiology d/t history of CAD and elevated troponins -CXR and CT head w/o contrast showed no acute findings -Left hip xray showed intertrochanteric comminuted left proximal femur fracture -Left elbow xray was negative for acute findings. -Telemetry -Cardiology consult -Pending cardiology consult, open reduction internal fixation planned for 04/23/2014  Elevated Troponins -Pt has history of coronary artery disease -Serial troponins: 0.49, 0.48, 0.32 -Telemetry and cardiology consult as above. -There is also question of irregular rhythm, obtain 12-lead EKG.  Acute Urinary Retention -Pt reports needing to void and feeling lower abdominal discomfort -possibly related to prostate history -Output 100 mL -Bladder scan showed 595 mL -Foley catheter placed.  Dementia -Patient is awake alert oriented has trouble remembering events.  Code Status: DNR Family Communication: Plan discussed with the patient. Disposition Plan: Remains inpatient Diet: Diet  regular Diet NPO time specified  Consultants:  Cardiology  Orthopedics  Procedures:  Open reduction internal fixation planned for 04/23/2014  Antibiotics:  None   Objective: Filed Vitals:   04/22/14 1200  BP:   Pulse:   Temp:   Resp: 18    Intake/Output Summary (Last 24 hours) at 04/22/14 1243 Last data filed at 04/22/14 1133  Gross per 24 hour  Intake 293.75 ml  Output    500 ml  Net -206.25 ml   Filed Weights   04/22/14 0300  Weight: 66.452 kg (146 lb 8 oz)    Exam: General: Alert and awake, oriented x 1, not in any acute distress. HEENT: anicteric sclera, pupils reactive to light, EOMI, MMM, no lymphadenopathy or masses CVS: irregular heart rate; S1-S2 clear, no murmur rubs or gallops; pedal pulses 3+ bilaterally Chest: clear to auscultation bilaterally, no wheezing, rales or rhonchi Abdomen: nondistended, normal bowel sounds, mild tenderness at lower abdomen, bladder palpable, otherwise soft; bowel sounds present Extremities: no cyanosis, clubbing or edema noted bilaterally; 5/5 strength bilateraly in upper and lower extremities.  Data Reviewed: Basic Metabolic Panel:  Recent Labs Lab 04/21/14 2333  NA 137  K 3.8  CL 106  CO2 23  GLUCOSE 167*  BUN 20  CREATININE 1.26  CALCIUM 9.0   Liver Function Tests:  Recent Labs Lab 04/21/14 2333  AST 26  ALT 13  ALKPHOS 49  BILITOT 0.7  PROT 6.3  ALBUMIN 3.6   No results for input(s): LIPASE, AMYLASE in the last 168 hours. No results for input(s): AMMONIA in the last 168 hours. CBC:  Recent Labs Lab 04/21/14 2333  WBC 9.4  NEUTROABS 7.4  HGB 10.1*  HCT 31.6*  MCV 84.3  PLT 113*   Cardiac  Enzymes:  Recent Labs Lab 04/21/14 2333 04/22/14 0137 04/22/14 0710  TROPONINI 0.49* 0.48* 0.32*   BNP (last 3 results) No results for input(s): PROBNP in the last 8760 hours. CBG: No results for input(s): GLUCAP in the last 168 hours.  Micro Recent Results (from the past 240 hour(s))    Surgical pcr screen     Status: None   Collection Time: 04/22/14  4:25 AM  Result Value Ref Range Status   MRSA, PCR NEGATIVE NEGATIVE Final   Staphylococcus aureus NEGATIVE NEGATIVE Final    Comment:        The Xpert SA Assay (FDA approved for NASAL specimens in patients over 37 years of age), is one component of a comprehensive surveillance program.  Test performance has been validated by [facility] for patients greater than or equal to 41 year old. It is not intended to diagnose infection nor to guide or monitor treatment.      Studies: Dg Chest 1 View  04/22/2014   CLINICAL DATA:  Fall with left hip pain.  EXAM: CHEST - 1 VIEW  COMPARISON:  05/19/2009  FINDINGS: Cardiac silhouette top-normal in size. Aorta is uncoiled. No mediastinal or hilar masses. Lungs are clear. No pleural effusion or pneumothorax.  Bony thorax is diffusely demineralized but grossly intact.  IMPRESSION: No acute cardiopulmonary disease.   Electronically Signed   By: Lajean Manes M.D.   On: 04/22/2014 00:25   Dg Elbow 2 Views Left  04/22/2014   CLINICAL DATA:  Fall with left elbow pain.  Initial encounter.  EXAM: LEFT ELBOW - 2 VIEW  COMPARISON:  None.  FINDINGS: No evidence of joint effusion, fracture, or dislocation. No acute soft tissue findings.  IMPRESSION: Negative.   Electronically Signed   By: Jorje Guild M.D.   On: 04/22/2014 02:52   Ct Head Wo Contrast  04/22/2014   CLINICAL DATA:  Per EMS, the patient is from [facility] in [place], and fell tonight trying to use the restroom. He typically uses a walker, the son reports. Fall was around 7:50 tonight, with confirmed x-ray of left hip at the facility at 2030, not present with paperwork on arrival. 100 mcg of fentanyl given by ems, for pain management, side effects of confusion and a-fib with abberancy on ekg.  EXAM: CT HEAD WITHOUT CONTRAST  TECHNIQUE: Contiguous axial images were obtained from the base of the skull through the vertex without  intravenous contrast.  COMPARISON:  12/13/2013  FINDINGS: Ventricles normal configuration. There is ventricular and sulcal enlargement reflecting a moderate atrophy. No hydrocephalus.  No parenchymal masses or mass effect. There is no evidence of a a cortical infarct.  Patchy areas of white matter hypoattenuation are noted, stable, consistent with moderate chronic microvascular ischemic change.  No extra-axial masses or abnormal fluid collections.  There is no intracranial hemorrhage.  Fluid level in the right sphenoid sinus, stable from the prior exam. Remaining sinuses are clear as are the mastoid air cells. No skull fracture.  IMPRESSION: 1. No acute intracranial abnormalities. 2. Atrophy and chronic microvascular ischemic change, stable.   Electronically Signed   By: Lajean Manes M.D.   On: 04/22/2014 00:09   Dg Hip Unilat With Pelvis 2-3 Views Left  04/21/2014   CLINICAL DATA:  Fall tonight.  Left hip pain.  EXAM: DG HIP W/ PELVIS 2-3V*L*  COMPARISON:  None.  FINDINGS: There is an intertrochanteric fracture of the proximal left femur. There are greater and lesser trochanteric fracture fragments. The primary  fracture components are mildly displaced, 2.4 cm, with the inferior neck fracture component overlapped on the metaphyseal component. There is varus angulation.  No other fractures. No dislocation. Bones are extensively demineralized. There are dense calcifications along the femoral vessels.  IMPRESSION: Mildly displaced intertrochanteric comminuted left proximal femur fracture with varus angulation.   Electronically Signed   By: Lajean Manes M.D.   On: 04/21/2014 23:59    Scheduled Meds: . acetaminophen  500 mg Oral TID  . aspirin EC  325 mg Oral Daily  . iron polysaccharides  150 mg Oral Daily  . isosorbide mononitrate  60 mg Oral Daily  . lisinopril  10 mg Oral BID  . metoprolol succinate  50 mg Oral Daily  . oxybutynin  10 mg Oral Daily  . pantoprazole  40 mg Oral Daily  . vitamin B-12   1,000 mcg Oral Daily   Continuous Infusions: . sodium chloride 75 mL/hr at 04/22/14 0135       Time spent: 35 minutes    Magda Bernheim, PA-S  [facility] Pager [phone] If 7PM-7AM, please contact night-coverage at [email or web address], password [facility] 04/22/2014, 12:43 PM  LOS: 1 day       Addendum  Patient seen and examined, chart and data base reviewed.  I agree with the above assessment and plan.  For full details please see Mrs. Magda Bernheim, PA-S note.  I reviewed and amended the above note as appropriate.   Birdie Hopes, MD [facility] Pager: [phone] 04/22/2014, 1:39 PM

## 2014-04-22 NOTE — ED Notes (Signed)
Attempted to call report. Also paged Dr. Alcario Drought.

## 2014-04-22 NOTE — Progress Notes (Signed)
Orthopedic Tech Progress Note Patient Details:  Jordan Little 18-Aug-1915 [id number] Patient unable to use OHF as designed due to deficits in strength.  Patient ID: [name], male   DOB: December 04, 1915, 81 y.o.   MRN: [id number]   Fenton Foy 04/22/2014, 11:42 AM

## 2014-04-22 NOTE — Progress Notes (Signed)
  Echocardiogram 2D Echocardiogram has been performed.  Jordan Little 04/22/2014, 2:10 PM

## 2014-04-22 NOTE — Progress Notes (Signed)
Dr. Harrington Challenger paged and notified of pt Afib on telemetry and HR up to 140's. MD ordered to given 5mg  Lopressor IV once and Lopressor 25 mg every 6 hours po with parameters to hold dose if SBP<100, HR <60. P. Amo Sherrilynn Gudgel RN.

## 2014-04-22 NOTE — ED Notes (Signed)
Dr. Alcario Drought called, MD gives verbal order for x-ray of left arm.

## 2014-04-22 NOTE — Clinical Social Work Psychosocial (Signed)
Clinical Social Work Department BRIEF PSYCHOSOCIAL ASSESSMENT 04/22/2014  Patient:  Jordan Little, Jordan Little     Account Number:  [id number]     Admit date:  04/21/2014  Clinical Social Worker:  Domenica Reamer, [place]  Date/Time:  04/22/2014 01:49 PM  Referred by:  Physician  Date Referred:  04/22/2014 Referred for  SNF Placement   Other Referral:   Interview type:  Family Other interview type:    PSYCHOSOCIAL DATA Living Status:  FACILITY Admitted from facility:  [facility] at [facility] Level of care:  Assisted Living Primary support name:  Jiles Prows Primary support relationship to patient:  CHILD, ADULT Degree of support available:   high level of support from patients son and his family    CURRENT CONCERNS Current Concerns  Post-Acute Placement   Other Concerns:    SOCIAL WORK ASSESSMENT / PLAN CSW spoke with patients son conerning return to [facility] after DC from hospital.  Patients son is agreeable to return and is hopeful that the patient will recover soon and be able to go into rehab at [facility] before returning to ALF.   Assessment/plan status:  Psychosocial Support/Ongoing Assessment of Needs Other assessment/ plan:   FL2 update   Information/referral to community resources:    PATIENT'S/FAMILY'S RESPONSE TO PLAN OF CARE: Patients family is agreeable to patient return to [facility] and is hopeful that patient will make a quick recovery from his hip fracture.       Domenica Reamer, [place] Social Worker [phone]

## 2014-04-22 NOTE — Consult Note (Signed)
Reason for Consult:Intertrochanteric hip fracture left Referring Physician: Hospitalists  Jordan Little is an 81 y.o. male.  HPI: The patient is a 81 year old male who fell earlier today and suffered an intertrochanteric hip fracture on the left side.  The patient will be admitted  by the internal medicine service.  We will plan on open reduction internal fixation once the patient has been cleared.Patient complains of significant left hip pain is been unable walker the last couple of days.  Past Medical History  Diagnosis Date  . Prostate cancer     [facility], Dr. Gaynelle Arabian  . CAD (coronary artery disease)     Dr Gwenlyn Found  . Hyperlipidemia   . Hypertension   . Diverticulosis 2010    with colon bleed  . Personal history of renal calculi 2007  . Pneumonia 2007  . Diverticulitis 01/05/2012    [facility]  . Pernicious anemia     Past Surgical History  Procedure Laterality Date  . Coronary angioplasty with stent placement  1999    Dr. Gwenlyn Found, 4 vessels  . Transurethral resection of prostate      Dr.   Marla Roe  . Prostate surgery      Dr. Gaynelle Arabian  . Laparoscopic appendectomy  07/06/2011    Procedure: APPENDECTOMY LAPAROSCOPIC;  Surgeon: Adin Hector, MD;  Location: [facility];  Service: General;  Laterality: N/A;    Family History  Problem Relation Age of Onset  . Cancer Mother   . Cancer Father     prostate    Social History:  reports that he has quit smoking. He quit smokeless tobacco use about 42 years ago. He reports that he does not drink alcohol or use illicit drugs.  Allergies:  Allergies  Allergen Reactions  . Iodine     Throat swelling  . Dilaudid [Hydromorphone Hcl]     confusion  . Fentanyl     Confusion     Medications: I have reviewed the patient's current medications.  Results for orders placed or performed during the hospital encounter of 04/21/14 (from the past 48 hour(s))  CBC with Differential     Status: Abnormal    Collection Time: 04/21/14 11:33 PM  Result Value Ref Range   WBC 9.4 4.0 - 10.5 K/uL   RBC 3.75 (L) 4.22 - 5.81 MIL/uL   Hemoglobin 10.1 (L) 13.0 - 17.0 g/dL   HCT 31.6 (L) 39.0 - 52.0 %   MCV 84.3 78.0 - 100.0 fL   MCH 26.9 26.0 - 34.0 pg   MCHC 32.0 30.0 - 36.0 g/dL   RDW 19.3 (H) 11.5 - 15.5 %   Platelets 113 (L) 150 - 400 K/uL    Comment: REPEATED TO VERIFY SPECIMEN CHECKED FOR CLOTS PLATELET COUNT CONFIRMED BY SMEAR    Neutrophils Relative % 78 (H) 43 - 77 %   Neutro Abs 7.4 1.7 - 7.7 K/uL   Lymphocytes Relative 14 12 - 46 %   Lymphs Abs 1.3 0.7 - 4.0 K/uL   Monocytes Relative 8 3 - 12 %   Monocytes Absolute 0.8 0.1 - 1.0 K/uL   Eosinophils Relative 0 0 - 5 %   Eosinophils Absolute 0.0 0.0 - 0.7 K/uL   Basophils Relative 0 0 - 1 %   Basophils Absolute 0.0 0.0 - 0.1 K/uL  Comprehensive metabolic panel     Status: Abnormal   Collection Time: 04/21/14 11:33 PM  Result Value Ref Range   Sodium 137 135 -  145 mmol/L    Comment: Please note change in reference range.   Potassium 3.8 3.5 - 5.1 mmol/L    Comment: Please note change in reference range.   Chloride 106 96 - 112 mEq/L   CO2 23 19 - 32 mmol/L   Glucose, Bld 167 (H) 70 - 99 mg/dL   BUN 20 6 - 23 mg/dL   Creatinine, Ser 1.26 0.50 - 1.35 mg/dL   Calcium 9.0 8.4 - 10.5 mg/dL   Total Protein 6.3 6.0 - 8.3 g/dL   Albumin 3.6 3.5 - 5.2 g/dL   AST 26 0 - 37 U/L   ALT 13 0 - 53 U/L   Alkaline Phosphatase 49 39 - 117 U/L   Total Bilirubin 0.7 0.3 - 1.2 mg/dL   GFR calc non Af Amer 46 (L) >90 mL/min   GFR calc Af Amer 54 (L) >90 mL/min    Comment: (NOTE) The eGFR has been calculated using the CKD EPI equation. This calculation has not been validated in all clinical situations. eGFR's persistently <90 mL/min signify possible Chronic Kidney Disease.    Anion gap 8 5 - 15  Troponin I     Status: Abnormal   Collection Time: 04/21/14 11:33 PM  Result Value Ref Range   Troponin I 0.49 (H) <0.031 ng/mL    Comment:         PERSISTENTLY INCREASED TROPONIN VALUES IN THE RANGE OF 0.04-0.49 ng/mL CAN BE SEEN IN:       -UNSTABLE ANGINA       -CONGESTIVE HEART FAILURE       -MYOCARDITIS       -CHEST TRAUMA       -ARRYHTHMIAS       -LATE PRESENTING MYOCARDIAL INFARCTION       -COPD   CLINICAL FOLLOW-UP RECOMMENDED. Please note change in reference range.   Protime-INR     Status: None   Collection Time: 04/21/14 11:33 PM  Result Value Ref Range   Prothrombin Time 13.1 11.6 - 15.2 seconds   INR 0.98 0.00 - 1.49  Urinalysis, Routine w reflex microscopic     Status: Abnormal   Collection Time: 04/22/14 12:06 AM  Result Value Ref Range   Color, Urine YELLOW YELLOW   APPearance CLOUDY (A) CLEAR   Specific Gravity, Urine 1.022 1.005 - 1.030   pH 5.0 5.0 - 8.0   Glucose, UA NEGATIVE NEGATIVE mg/dL   Hgb urine dipstick NEGATIVE NEGATIVE   Bilirubin Urine NEGATIVE NEGATIVE   Ketones, ur NEGATIVE NEGATIVE mg/dL   Protein, ur NEGATIVE NEGATIVE mg/dL   Urobilinogen, UA 0.2 0.0 - 1.0 mg/dL   Nitrite NEGATIVE NEGATIVE   Leukocytes, UA SMALL (A) NEGATIVE  Urine microscopic-add on     Status: Abnormal   Collection Time: 04/22/14 12:06 AM  Result Value Ref Range   Squamous Epithelial / LPF RARE RARE   WBC, UA 3-6 <3 WBC/hpf   Bacteria, UA RARE RARE   Casts HYALINE CASTS (A) NEGATIVE    Dg Chest 1 View  04/22/2014   CLINICAL DATA:  Fall with left hip pain.  EXAM: CHEST - 1 VIEW  COMPARISON:  05/19/2009  FINDINGS: Cardiac silhouette top-normal in size. Aorta is uncoiled. No mediastinal or hilar masses. Lungs are clear. No pleural effusion or pneumothorax.  Bony thorax is diffusely demineralized but grossly intact.  IMPRESSION: No acute cardiopulmonary disease.   Electronically Signed   By: Lajean Manes M.D.   On: 04/22/2014 00:25   Ct Head Wo  Contrast  04/22/2014   CLINICAL DATA:  Per EMS, the patient is from [facility] in [place], and fell tonight trying to use the restroom. He typically uses a walker, the son  reports. Fall was around 7:50 tonight, with confirmed x-ray of left hip at the facility at 2030, not present with paperwork on arrival. 100 mcg of fentanyl given by ems, for pain management, side effects of confusion and a-fib with abberancy on ekg.  EXAM: CT HEAD WITHOUT CONTRAST  TECHNIQUE: Contiguous axial images were obtained from the base of the skull through the vertex without intravenous contrast.  COMPARISON:  12/13/2013  FINDINGS: Ventricles normal configuration. There is ventricular and sulcal enlargement reflecting a moderate atrophy. No hydrocephalus.  No parenchymal masses or mass effect. There is no evidence of a a cortical infarct.  Patchy areas of white matter hypoattenuation are noted, stable, consistent with moderate chronic microvascular ischemic change.  No extra-axial masses or abnormal fluid collections.  There is no intracranial hemorrhage.  Fluid level in the right sphenoid sinus, stable from the prior exam. Remaining sinuses are clear as are the mastoid air cells. No skull fracture.  IMPRESSION: 1. No acute intracranial abnormalities. 2. Atrophy and chronic microvascular ischemic change, stable.   Electronically Signed   By: Lajean Manes M.D.   On: 04/22/2014 00:09   Dg Hip Unilat With Pelvis 2-3 Views Left  04/21/2014   CLINICAL DATA:  Fall tonight.  Left hip pain.  EXAM: DG HIP W/ PELVIS 2-3V*L*  COMPARISON:  None.  FINDINGS: There is an intertrochanteric fracture of the proximal left femur. There are greater and lesser trochanteric fracture fragments. The primary fracture components are mildly displaced, 2.4 cm, with the inferior neck fracture component overlapped on the metaphyseal component. There is varus angulation.  No other fractures. No dislocation. Bones are extensively demineralized. There are dense calcifications along the femoral vessels.  IMPRESSION: Mildly displaced intertrochanteric comminuted left proximal femur fracture with varus angulation.   Electronically Signed    By: Lajean Manes M.D.   On: 04/21/2014 23:59    ROS  ROS: I have reviewed the patient's review of systems thoroughly and there are no positive responses as relates to the HPI. EXAM: Blood pressure 133/73, pulse 70, temperature 97.6 F (36.4 C), resp. rate 21, SpO2 98 %. Physical Exam Well-developed well-nourished patient in no acute distress. Alert and oriented x3 HEENT:within normal limits Cardiac: Regular rate and rhythm Pulmonary: Lungs clear to auscultation Abdomen: Soft and nontender.  Normal active bowel sounds  Musculoskeletal: (Left leg: external rotation and shortened appearance.  Neurovascularly intact distally.Pain with internal rotation.  Assessment/Plan: 81 year old male with significant medical comorbidities who fell earlier today and suffered a left intertrochanteric hip fracture.//The patient will need admission And surgical clearance by the medical service.  Ultimately he will need open reduction and internal fixation.I have had a prolonged discussion with the patient regarding the risk and benefits of the surgical procedure.  The patient understands the risks include but are not limited to bleeding, infection and failure of the surgery to cure the problem and need for further surgery.  The patient understands there is a slight risk of death at the time of surgery.  The patient understands these risks along with the potential benefits and wishes to proceed with surgical intervention.  The patient will be followed in the office in the postoperative period.I make a plan to try to do his surgery later tonight if patient is cleared medically.  Mykeria Garman L 04/22/2014, 2:02  AM

## 2014-04-22 NOTE — Progress Notes (Signed)
Telemetry applied and called in to [place] RN

## 2014-04-22 NOTE — ED Notes (Signed)
Patient is allowed PO meds only while NPO.

## 2014-04-22 NOTE — Progress Notes (Signed)
Pt son remains at bedside requesting to speak with Ortho surgeon; Dr. Berenice Primas paged and MD returns call back; said to walk up to see pt son. Pt son and informed. Reported off to incoming RN. Francis Gaines Carrie Usery RN.

## 2014-04-22 NOTE — Progress Notes (Signed)
Pt EKG completed and placed in chart. Dr. Hartford Poli paged and notified. No new orders received; will continue to monitor pt quietly. P. Amo Declyn Delsol Rn.

## 2014-04-22 NOTE — ED Notes (Signed)
Phlebotomy at the bedside  

## 2014-04-22 NOTE — Progress Notes (Signed)
I have reviewed echo  Very difficult study as patient could not move. Apical windows are foreshortened. Overall LVEF is probably mildly decreased  RVEF si normal Inferior hypokinesis.  Patinet with significant atrial ectopy I have added metoprolol  On review of all data for this patient  (age, cardiac history, Mild LV dysfunction, minimal bump in troponin, atrial arrhythmia) patient is at least a moderate risk for cardiac complication in periop period.  I do not think it is prohibitive.  I would not, with age, recomm further testing as this could lead to complication, may not decrease risk.

## 2014-04-22 NOTE — H&P (Addendum)
[facility] History and Physical  Jordan Little YJE:[id number] DOB: 1915/05/29 DOA: 04/21/2014  Referring physician: EDP PCP: [name], MD   Chief Complaint: Fall   HPI: Jordan Little is a 81 y.o. male who suffered a mechanical fall 3-5 hours ago in his bathroom at his NH.  Landed on L hip.  Now has L hip pain, inability to bear weight.  Pain is worse with movement, better a rest.  Pain is severe.  X ray confirms fracture.  Review of Systems: Systems reviewed.  As above, otherwise negative  Past Medical History  Diagnosis Date  . Prostate cancer     [facility], Dr. Gaynelle Arabian  . CAD (coronary artery disease)     Dr Gwenlyn Found  . Hyperlipidemia   . Hypertension   . Diverticulosis 2010    with colon bleed  . Personal history of renal calculi 2007  . Pneumonia 2007  . Diverticulitis 01/05/2012    [facility]  . Pernicious anemia    Past Surgical History  Procedure Laterality Date  . Coronary angioplasty with stent placement  1999    Dr. Gwenlyn Found, 4 vessels  . Transurethral resection of prostate      Dr.   Marla Roe  . Prostate surgery      Dr. Gaynelle Arabian  . Laparoscopic appendectomy  07/06/2011    Procedure: APPENDECTOMY LAPAROSCOPIC;  Surgeon: Adin Hector, MD;  Location: [facility];  Service: General;  Laterality: N/A;   Social History:  reports that he has quit smoking. He quit smokeless tobacco use about 42 years ago. He reports that he does not drink alcohol or use illicit drugs.  Allergies  Allergen Reactions  . Iodine     Throat swelling  . Dilaudid [Hydromorphone Hcl]     confusion  . Fentanyl     Confusion     Family History  Problem Relation Age of Onset  . Cancer Mother   . Cancer Father     prostate     Prior to Admission medications   Medication Sig Start Date End Date Taking? Authorizing Provider  acetaminophen (TYLENOL) 500 MG tablet Take 500 mg by mouth 3 (three) times daily.   Yes Historical Provider, MD  aspirin 81  MG tablet Take 81 mg by mouth daily.     Yes Historical Provider, MD  ergocalciferol (VITAMIN D2) 50000 UNITS capsule Take 50,000 Units by mouth once a week.   Yes Historical Provider, MD  glucosamine-chondroitin 500-400 MG tablet Take 2 tablets by mouth daily.    Yes Historical Provider, MD  iron polysaccharides (NIFEREX) 150 MG capsule Take 150 mg by mouth daily.   Yes Historical Provider, MD  isosorbide mononitrate (IMDUR) 60 MG 24 hr tablet Take 60 mg by mouth daily.     Yes Historical Provider, MD  lisinopril (PRINIVIL,ZESTRIL) 10 MG tablet Take 10 mg by mouth 2 (two) times daily.   Yes Historical Provider, MD  Menthol, Topical Analgesic, 4 % GEL Apply 1 inch topically daily.   Yes Historical Provider, MD  metoprolol succinate (TOPROL-XL) 50 MG 24 hr tablet Take 50 mg by mouth daily. Take with or immediately following a meal.   Yes Historical Provider, MD  oxybutynin (DITROPAN-XL) 10 MG 24 hr tablet TAKE ONE TABLET BY MOUTH ONE TIME DAILY 01/16/12  Yes Hendricks Limes, MD  pantoprazole (PROTONIX) 40 MG tablet Take 40 mg by mouth daily.   Yes Historical Provider, MD  polyethylene glycol (MIRALAX / GLYCOLAX) packet  Take 17 g by mouth daily as needed for mild constipation.   Yes Historical Provider, MD  traMADol (ULTRAM) 50 MG tablet Take 1 tablet (50 mg total) by mouth every 6 (six) hours as needed for pain. 07/18/10  Yes Hendricks Limes, MD  vitamin B-12 (CYANOCOBALAMIN) 1000 MCG tablet Take 1,000 mcg by mouth daily.   Yes Historical Provider, MD   Physical Exam: Filed Vitals:   04/22/14 0045  BP: 142/78  Pulse: 73  Temp:   Resp: 21    BP 142/78 mmHg  Pulse 73  Temp(Src) 97.6 F (36.4 C)  Resp 21  SpO2 96%  General Appearance:    Alert, oriented, no distress, appears stated age  Head:    Normocephalic, atraumatic  Eyes:    PERRL, EOMI, sclera non-icteric        Nose:   Nares without drainage or epistaxis. Mucosa, turbinates normal  Throat:   Moist mucous membranes. Oropharynx  without erythema or exudate.  Neck:   Supple. No carotid bruits.  No thyromegaly.  No lymphadenopathy.   Back:     No CVA tenderness, no spinal tenderness  Lungs:     Clear to auscultation bilaterally, without wheezes, rhonchi or rales  Chest wall:    No tenderness to palpitation  Heart:    Regular rate and rhythm without murmurs, gallops, rubs  Abdomen:     Soft, non-tender, nondistended, normal bowel sounds, no organomegaly  Genitalia:    deferred  Rectal:    deferred  Extremities:   Decreased ROM in L hip.  Pulses:   2+ and symmetric all extremities  Skin:   Skin color, texture, turgor normal, no rashes or lesions  Lymph nodes:   Cervical, supraclavicular, and axillary nodes normal  Neurologic:   CNII-XII intact. Normal strength, sensation and reflexes      throughout    Labs on Admission:  Basic Metabolic Panel:  Recent Labs Lab 04/21/14 2333  NA 137  K 3.8  CL 106  CO2 23  GLUCOSE 167*  BUN 20  CREATININE 1.26  CALCIUM 9.0   Liver Function Tests:  Recent Labs Lab 04/21/14 2333  AST 26  ALT 13  ALKPHOS 49  BILITOT 0.7  PROT 6.3  ALBUMIN 3.6   No results for input(s): LIPASE, AMYLASE in the last 168 hours. No results for input(s): AMMONIA in the last 168 hours. CBC:  Recent Labs Lab 04/21/14 2333  WBC 9.4  NEUTROABS 7.4  HGB 10.1*  HCT 31.6*  MCV 84.3  PLT 113*   Cardiac Enzymes:  Recent Labs Lab 04/21/14 2333  TROPONINI 0.49*    BNP (last 3 results) No results for input(s): PROBNP in the last 8760 hours. CBG: No results for input(s): GLUCAP in the last 168 hours.  Radiological Exams on Admission: Dg Chest 1 View  04/22/2014   CLINICAL DATA:  Fall with left hip pain.  EXAM: CHEST - 1 VIEW  COMPARISON:  05/19/2009  FINDINGS: Cardiac silhouette top-normal in size. Aorta is uncoiled. No mediastinal or hilar masses. Lungs are clear. No pleural effusion or pneumothorax.  Bony thorax is diffusely demineralized but grossly intact.  IMPRESSION: No  acute cardiopulmonary disease.   Electronically Signed   By: Lajean Manes M.D.   On: 04/22/2014 00:25   Ct Head Wo Contrast  04/22/2014   CLINICAL DATA:  Per EMS, the patient is from [facility] in [place], and fell tonight trying to use the restroom. He typically uses a Little, the son  reports. Fall was around 7:50 tonight, with confirmed x-ray of left hip at the facility at 2030, not present with paperwork on arrival. 100 mcg of fentanyl given by ems, for pain management, side effects of confusion and a-fib with abberancy on ekg.  EXAM: CT HEAD WITHOUT CONTRAST  TECHNIQUE: Contiguous axial images were obtained from the base of the skull through the vertex without intravenous contrast.  COMPARISON:  12/13/2013  FINDINGS: Ventricles normal configuration. There is ventricular and sulcal enlargement reflecting a moderate atrophy. No hydrocephalus.  No parenchymal masses or mass effect. There is no evidence of a a cortical infarct.  Patchy areas of white matter hypoattenuation are noted, stable, consistent with moderate chronic microvascular ischemic change.  No extra-axial masses or abnormal fluid collections.  There is no intracranial hemorrhage.  Fluid level in the right sphenoid sinus, stable from the prior exam. Remaining sinuses are clear as are the mastoid air cells. No skull fracture.  IMPRESSION: 1. No acute intracranial abnormalities. 2. Atrophy and chronic microvascular ischemic change, stable.   Electronically Signed   By: Lajean Manes M.D.   On: 04/22/2014 00:09   Dg Hip Unilat With Pelvis 2-3 Views Left  04/21/2014   CLINICAL DATA:  Fall tonight.  Left hip pain.  EXAM: DG HIP W/ PELVIS 2-3V*L*  COMPARISON:  None.  FINDINGS: There is an intertrochanteric fracture of the proximal left femur. There are greater and lesser trochanteric fracture fragments. The primary fracture components are mildly displaced, 2.4 cm, with the inferior neck fracture component overlapped on the metaphyseal component.  There is varus angulation.  No other fractures. No dislocation. Bones are extensively demineralized. There are dense calcifications along the femoral vessels.  IMPRESSION: Mildly displaced intertrochanteric comminuted left proximal femur fracture with varus angulation.   Electronically Signed   By: Lajean Manes M.D.   On: 04/21/2014 23:59    EKG: Independently reviewed.  Assessment/Plan Principal Problem:   Closed left hip fracture Active Problems:   Elevated troponin   1. Closed left hip fracture - 1. Hip fracture protocol 2. NPO 3. ASA and SCDs for DVT ppx 4. EDP is calling orthopaedics 5. Needs cards eval before surgery (see below) 2. Elevated troponin - concerning given the fact that patient has had 2 falls over past week with associated dizziness causing the falls.  Also patient has h/o CAD with coronary stents previously. 1. Tele monitor 2. Trending troponins 3. Cards consulted for pre-op eval and risk stratification. 4. No active chest pain at this time to make me think patient has active acute ACS, no SOB, asymptomatic from cardiac standpoint except for the dizziness with walking earlier today (not present now).    Code Status: DNR/DNI Family Communication: [place] at bedside Disposition Plan: Admit to inpatient   Time spent: 70 min  Temesha Queener M. [facility] Pager [phone]  If 7AM-7PM, please contact the day team taking care of the patient [email or web address] Password [email or web address] 04/22/2014, 1:19 AM

## 2014-04-22 NOTE — Progress Notes (Signed)
Dr Berenice Primas came in to see pt and he came to RN to inform him pt report needing to void and discomfort; MD was informed pt had just voided 134ml; MD said pt might need foley and to bladder scan pt; if pt not emptying to insert foley. Pt voided 168ml at 1100, bladder scanned for 568ml; foley inserted at 1218. Will continue to monitor pt quietly. Francis Gaines Chaylee Ehrsam RN.

## 2014-04-22 NOTE — Consult Note (Signed)
Primary Physician: Primary Cardiologist:  Gwenlyn Found     HPI: Asked to see re preop risk stratification   Patient is a 81 yo who was admitted  With fall and L hip fx.   Patients cardiac history:  PCI to RCA 1997;  PCI to RCA and LAD 1999  Myoview July 2005  Low risk  Normal LV function.  Echo in 2009 showed normal LVEF  Moderate MR   Patient also has a history of Mild PVOD, hyperlipidemia  TIA, HTN, diverticulitis  Patinet says he fell at home  Missed step He denies CP  Breathing is OK  No dizziness  No syncope  I spoke to son  He says patient lives at assisted living at [facility].  But, gets along well with walker  Walks to dinner  Son says " i have to keep up with him"  Son is unaware of any heart complaints from his father.      Past Medical History  Diagnosis Date  . Prostate cancer     [facility] Implant, Dr. Gaynelle Arabian  . CAD (coronary artery disease)     Dr Gwenlyn Found  . Hyperlipidemia   . Hypertension   . Diverticulosis 2010    with colon bleed  . Personal history of renal calculi 2007  . Pneumonia 2007  . Diverticulitis 01/05/2012    [facility]  . Pernicious anemia     Medications Prior to Admission  Medication Sig Dispense Refill  . acetaminophen (TYLENOL) 500 MG tablet Take 500 mg by mouth 3 (three) times daily.    Marland Kitchen aspirin 81 MG tablet Take 81 mg by mouth daily.      . ergocalciferol (VITAMIN D2) 50000 UNITS capsule Take 50,000 Units by mouth once a week.    Marland Kitchen glucosamine-chondroitin 500-400 MG tablet Take 2 tablets by mouth daily.     . iron polysaccharides (NIFEREX) 150 MG capsule Take 150 mg by mouth daily.    . isosorbide mononitrate (IMDUR) 60 MG 24 hr tablet Take 60 mg by mouth daily.      Marland Kitchen lisinopril (PRINIVIL,ZESTRIL) 10 MG tablet Take 10 mg by mouth 2 (two) times daily.    . Menthol, Topical Analgesic, 4 % GEL Apply 1 inch topically daily.    . metoprolol succinate (TOPROL-XL) 50 MG 24 hr tablet Take 50 mg by mouth daily. Take with or immediately  following a meal.    . oxybutynin (DITROPAN-XL) 10 MG 24 hr tablet TAKE ONE TABLET BY MOUTH ONE TIME DAILY 30 tablet 0  . pantoprazole (PROTONIX) 40 MG tablet Take 40 mg by mouth daily.    . polyethylene glycol (MIRALAX / GLYCOLAX) packet Take 17 g by mouth daily as needed for mild constipation.    . traMADol (ULTRAM) 50 MG tablet Take 1 tablet (50 mg total) by mouth every 6 (six) hours as needed for pain. 30 tablet 2  . vitamin B-12 (CYANOCOBALAMIN) 1000 MCG tablet Take 1,000 mcg by mouth daily.       Marland Kitchen acetaminophen  500 mg Oral TID  . aspirin EC  325 mg Oral Daily  . iron polysaccharides  150 mg Oral Daily  . isosorbide mononitrate  60 mg Oral Daily  . lisinopril  10 mg Oral BID  . metoprolol succinate  50 mg Oral Daily  . oxybutynin  10 mg Oral Daily  . pantoprazole  40 mg Oral Daily  . vitamin B-12  1,000 mcg Oral Daily    Infusions: .  sodium chloride 75 mL/hr at 04/22/14 0135    Allergies  Allergen Reactions  . Iodine     Throat swelling  . Dilaudid [Hydromorphone Hcl]     confusion  . Fentanyl     Confusion     History   Social History  . Marital Status: Widowed    Spouse Name: N/A    Number of Children: N/A  . Years of Education: N/A   Occupational History  . Not on file.   Social History Main Topics  . Smoking status: Former Research scientist (life sciences)  . Smokeless tobacco: Former Systems developer    Quit date: 07/17/1971  . Alcohol Use: No  . Drug Use: No  . Sexual Activity: Not on file   Other Topics Concern  . Not on file   Social History Narrative    Family History  Problem Relation Age of Onset  . Cancer Mother   . Cancer Father     prostate    REVIEW OF SYSTEMS:  All systems reviewed  Negative to the above problem except as noted above.    PHYSICAL EXAM: Filed Vitals:   04/22/14 1200  BP:   Pulse:   Temp:   Resp: 18     Intake/Output Summary (Last 24 hours) at 04/22/14 1226 Last data filed at 04/22/14 1133  Gross per 24 hour  Intake 293.75 ml  Output     500 ml  Net -206.25 ml    General:  Well appearing. No respiratory difficulty HEENT: normal Neck: supple. no JVD. Carotids 2+ bilat; no bruits. No lymphadenopathy or thryomegaly appreciated. Cor: Distant   Regular rate & rhythm. No rubs, gallops or murmurs. Lungs: clear anteriorly   Abdomen: soft, nontender, nondistended. No hepatosplenomegaly. No bruits or masses. Good bowel sounds. Extremities: no cyanosis, clubbing, rash, edema Neuro: alert & oriented x 3, cranial nerves grossly intact. moves all 4 extremities w/o difficulty. Affect pleasant.  ECG:  SR 65  IWMI  SL ST depression inferiorly with T wave inversion inferolaterally.  No signif change from 2013.    Tele:  SR with PACs, PAT with abberancy   Results for orders placed or performed during the hospital encounter of 04/21/14 (from the past 24 hour(s))  CBC with Differential     Status: Abnormal   Collection Time: 04/21/14 11:33 PM  Result Value Ref Range   WBC 9.4 4.0 - 10.5 K/uL   RBC 3.75 (L) 4.22 - 5.81 MIL/uL   Hemoglobin 10.1 (L) 13.0 - 17.0 g/dL   HCT 31.6 (L) 39.0 - 52.0 %   MCV 84.3 78.0 - 100.0 fL   MCH 26.9 26.0 - 34.0 pg   MCHC 32.0 30.0 - 36.0 g/dL   RDW 19.3 (H) 11.5 - 15.5 %   Platelets 113 (L) 150 - 400 K/uL   Neutrophils Relative % 78 (H) 43 - 77 %   Neutro Abs 7.4 1.7 - 7.7 K/uL   Lymphocytes Relative 14 12 - 46 %   Lymphs Abs 1.3 0.7 - 4.0 K/uL   Monocytes Relative 8 3 - 12 %   Monocytes Absolute 0.8 0.1 - 1.0 K/uL   Eosinophils Relative 0 0 - 5 %   Eosinophils Absolute 0.0 0.0 - 0.7 K/uL   Basophils Relative 0 0 - 1 %   Basophils Absolute 0.0 0.0 - 0.1 K/uL  Comprehensive metabolic panel     Status: Abnormal   Collection Time: 04/21/14 11:33 PM  Result Value Ref Range   Sodium 137 135 - 145  mmol/L   Potassium 3.8 3.5 - 5.1 mmol/L   Chloride 106 96 - 112 mEq/L   CO2 23 19 - 32 mmol/L   Glucose, Bld 167 (H) 70 - 99 mg/dL   BUN 20 6 - 23 mg/dL   Creatinine, Ser 1.26 0.50 - 1.35 mg/dL   Calcium  9.0 8.4 - 10.5 mg/dL   Total Protein 6.3 6.0 - 8.3 g/dL   Albumin 3.6 3.5 - 5.2 g/dL   AST 26 0 - 37 U/L   ALT 13 0 - 53 U/L   Alkaline Phosphatase 49 39 - 117 U/L   Total Bilirubin 0.7 0.3 - 1.2 mg/dL   GFR calc non Af Amer 46 (L) >90 mL/min   GFR calc Af Amer 54 (L) >90 mL/min   Anion gap 8 5 - 15  Troponin I     Status: Abnormal   Collection Time: 04/21/14 11:33 PM  Result Value Ref Range   Troponin I 0.49 (H) <0.031 ng/mL  Protime-INR     Status: None   Collection Time: 04/21/14 11:33 PM  Result Value Ref Range   Prothrombin Time 13.1 11.6 - 15.2 seconds   INR 0.98 0.00 - 1.49  Urinalysis, Routine w reflex microscopic     Status: Abnormal   Collection Time: 04/22/14 12:06 AM  Result Value Ref Range   Color, Urine YELLOW YELLOW   APPearance CLOUDY (A) CLEAR   Specific Gravity, Urine 1.022 1.005 - 1.030   pH 5.0 5.0 - 8.0   Glucose, UA NEGATIVE NEGATIVE mg/dL   Hgb urine dipstick NEGATIVE NEGATIVE   Bilirubin Urine NEGATIVE NEGATIVE   Ketones, ur NEGATIVE NEGATIVE mg/dL   Protein, ur NEGATIVE NEGATIVE mg/dL   Urobilinogen, UA 0.2 0.0 - 1.0 mg/dL   Nitrite NEGATIVE NEGATIVE   Leukocytes, UA SMALL (A) NEGATIVE  Urine microscopic-add on     Status: Abnormal   Collection Time: 04/22/14 12:06 AM  Result Value Ref Range   Squamous Epithelial / LPF RARE RARE   WBC, UA 3-6 <3 WBC/hpf   Bacteria, UA RARE RARE   Casts HYALINE CASTS (A) NEGATIVE  Troponin I (q 6hr x 3)     Status: Abnormal   Collection Time: 04/22/14  1:37 AM  Result Value Ref Range   Troponin I 0.48 (H) <0.031 ng/mL  Surgical pcr screen     Status: None   Collection Time: 04/22/14  4:25 AM  Result Value Ref Range   MRSA, PCR NEGATIVE NEGATIVE   Staphylococcus aureus NEGATIVE NEGATIVE  Troponin I (q 6hr x 3)     Status: Abnormal   Collection Time: 04/22/14  7:10 AM  Result Value Ref Range   Troponin I 0.32 (H) <0.031 ng/mL   Dg Chest 1 View  04/22/2014   CLINICAL DATA:  Fall with left hip pain.  EXAM:  CHEST - 1 VIEW  COMPARISON:  05/19/2009  FINDINGS: Cardiac silhouette top-normal in size. Aorta is uncoiled. No mediastinal or hilar masses. Lungs are clear. No pleural effusion or pneumothorax.  Bony thorax is diffusely demineralized but grossly intact.  IMPRESSION: No acute cardiopulmonary disease.   Electronically Signed   By: Lajean Manes M.D.   On: 04/22/2014 00:25   Dg Elbow 2 Views Left  04/22/2014   CLINICAL DATA:  Fall with left elbow pain.  Initial encounter.  EXAM: LEFT ELBOW - 2 VIEW  COMPARISON:  None.  FINDINGS: No evidence of joint effusion, fracture, or dislocation. No acute soft tissue findings.  IMPRESSION: Negative.  Electronically Signed   By: Jorje Guild M.D.   On: 04/22/2014 02:52   Ct Head Wo Contrast  04/22/2014   CLINICAL DATA:  Per EMS, the patient is from [facility] in [place], and fell tonight trying to use the restroom. He typically uses a walker, the son reports. Fall was around 7:50 tonight, with confirmed x-ray of left hip at the facility at 2030, not present with paperwork on arrival. 100 mcg of fentanyl given by ems, for pain management, side effects of confusion and a-fib with abberancy on ekg.  EXAM: CT HEAD WITHOUT CONTRAST  TECHNIQUE: Contiguous axial images were obtained from the base of the skull through the vertex without intravenous contrast.  COMPARISON:  12/13/2013  FINDINGS: Ventricles normal configuration. There is ventricular and sulcal enlargement reflecting a moderate atrophy. No hydrocephalus.  No parenchymal masses or mass effect. There is no evidence of a a cortical infarct.  Patchy areas of white matter hypoattenuation are noted, stable, consistent with moderate chronic microvascular ischemic change.  No extra-axial masses or abnormal fluid collections.  There is no intracranial hemorrhage.  Fluid level in the right sphenoid sinus, stable from the prior exam. Remaining sinuses are clear as are the mastoid air cells. No skull fracture.  IMPRESSION: 1.  No acute intracranial abnormalities. 2. Atrophy and chronic microvascular ischemic change, stable.   Electronically Signed   By: Lajean Manes M.D.   On: 04/22/2014 00:09   Dg Hip Unilat With Pelvis 2-3 Views Left  04/21/2014   CLINICAL DATA:  Fall tonight.  Left hip pain.  EXAM: DG HIP W/ PELVIS 2-3V*L*  COMPARISON:  None.  FINDINGS: There is an intertrochanteric fracture of the proximal left femur. There are greater and lesser trochanteric fracture fragments. The primary fracture components are mildly displaced, 2.4 cm, with the inferior neck fracture component overlapped on the metaphyseal component. There is varus angulation.  No other fractures. No dislocation. Bones are extensively demineralized. There are dense calcifications along the femoral vessels.  IMPRESSION: Mildly displaced intertrochanteric comminuted left proximal femur fracture with varus angulation.   Electronically Signed   By: Lajean Manes M.D.   On: 04/21/2014 23:59     ASSESSMENT:  81 yo with known CAD  Now with fx of hip  Asked to evaluate. Patient and his son deny CP  Walks with walker prior to admit Exam without evid of CHF Tele with SR with extensive atrial ectopy.  Patient does not sense Labs signif for very mild elevation of troponin.  Dropping    Recomm  Would get echo to assess LVEF help risk stratify. Continue telemetry.

## 2014-04-22 NOTE — ED Notes (Signed)
Dr. Gardner, hospitalist, at the bedside.  

## 2014-04-22 NOTE — ED Notes (Signed)
Called x-ray about chest film ordered, already completed.

## 2014-04-22 NOTE — Progress Notes (Signed)
INITIAL NUTRITION ASSESSMENT  DOCUMENTATION CODES Per approved criteria  -Not Applicable   INTERVENTION: - Ensure Complete po BID, each supplement provides 350 kcal and 13 grams of protein - RD will continue to monitor for nutrition care plan.  NUTRITION DIAGNOSIS: Inadequate oral intake related to hip fracture as evidenced by pt report.   Goal: Pt to meet >/= 90% of their estimated nutrition needs   Monitor:  Weight trend, po intake, acceptance of supplements, labs  Reason for Assessment: Consult per Hip Fracture Protocol  81 y.o. male  Admitting Dx: Closed left hip fracture  ASSESSMENT: 81 y.o. male who suffered a mechanical fall 3-5 hours ago in his bathroom at his NH. Landed on L hip. Now has L hip pain, inability to bear weight. Pain is worse with movement, better a rest. Pain is severe. X ray confirms fracture.  - Pt to undergo surgical repair.  - Pt reports that he currently feels hungry and has no had any loss of appetite. - Pt reports wt loss over time of ~5-10 lbs. Unsure of time frame.  - no signs of fat or muscle wasting  Labs Reviewed  Height: Ht Readings from Last 1 Encounters:  01/05/12 5\' 8"  (1.727 m)    Weight: Wt Readings from Last 1 Encounters:  04/22/14 146 lb 8 oz (66.452 kg)    Ideal Body Weight: 154 lbs  % Ideal Body Weight: 95%  Wt Readings from Last 10 Encounters:  04/22/14 146 lb 8 oz (66.452 kg)  01/14/12 150 lb 9.6 oz (68.312 kg)  01/05/12 150 lb (68.04 kg)  07/17/11 150 lb (68.04 kg)  07/06/11 138 lb 14.2 oz (63 kg)  06/28/11 148 lb 3.2 oz (67.223 kg)  10/20/10 154 lb 3.2 oz (69.945 kg)  07/18/10 152 lb 12.8 oz (69.31 kg)  05/11/10 151 lb (68.493 kg)  05/03/10 156 lb (70.761 kg)    Usual Body Weight: ~150-155 lbs  % Usual Body Weight: 97%  BMI:  Body mass index is 22.28 kg/(m^2).  Estimated Nutritional Needs: Kcal: 1700-1900 Protein: 90-100 g Fluid: 1.7-1.9 L/day  Skin: Intact  Diet Order: Diet regular Diet  NPO time specified  EDUCATION NEEDS: -Education needs addressed   Intake/Output Summary (Last 24 hours) at 04/22/14 1247 Last data filed at 04/22/14 1133  Gross per 24 hour  Intake 293.75 ml  Output    500 ml  Net -206.25 ml    Last BM: prior to admission   Labs:   Recent Labs Lab 04/21/14 2333  NA 137  K 3.8  CL 106  CO2 23  BUN 20  CREATININE 1.26  CALCIUM 9.0  GLUCOSE 167*    CBG (last 3)  No results for input(s): GLUCAP in the last 72 hours.  Scheduled Meds: . acetaminophen  500 mg Oral TID  . aspirin EC  325 mg Oral Daily  . iron polysaccharides  150 mg Oral Daily  . isosorbide mononitrate  60 mg Oral Daily  . lisinopril  10 mg Oral BID  . metoprolol succinate  50 mg Oral Daily  . oxybutynin  10 mg Oral Daily  . pantoprazole  40 mg Oral Daily  . vitamin B-12  1,000 mcg Oral Daily    Continuous Infusions: . sodium chloride 75 mL/hr at 04/22/14 0135    Past Medical History  Diagnosis Date  . Prostate cancer     Viadur Implant, Dr. Gaynelle Arabian  . CAD (coronary artery disease)     Dr Gwenlyn Found  . Hyperlipidemia   .  Hypertension   . Diverticulosis 2010    with colon bleed  . Personal history of renal calculi 2007  . Pneumonia 2007  . Diverticulitis 01/05/2012    [facility]  . Pernicious anemia     Past Surgical History  Procedure Laterality Date  . Coronary angioplasty with stent placement  1999    Dr. Gwenlyn Found, 4 vessels  . Transurethral resection of prostate      Dr.   Marla Roe  . Prostate surgery      Dr. Gaynelle Arabian  . Laparoscopic appendectomy  07/06/2011    Procedure: APPENDECTOMY LAPAROSCOPIC;  Surgeon: Adin Hector, MD;  Location: [facility];  Service: General;  Laterality: N/A;    Laurette Schimke MS, RD, LDN

## 2014-04-22 NOTE — ED Notes (Signed)
Dr. Alcario Drought allows patient to be NPO with meds at this time

## 2014-04-22 NOTE — Progress Notes (Signed)
I reviewed the patient's x-rays.  The patient will need open reduction internal fixation of his hip fracture.  I will plan this for once the patient is medically cleared.  The patient will be admitted to the internal medicine service and surgery will be planned around the overall medical situation.  I have written a consult note and pended to outlined the plan.  It will be completed once I have a chance to evaluate the patient.

## 2014-04-23 ENCOUNTER — Encounter (HOSPITAL_COMMUNITY)
Admission: EM | Disposition: A | Discharge status: Skilled Nursing Facility | Payer: Self-pay | Source: Home / Self Care | Attending: Internal Medicine

## 2014-04-23 ENCOUNTER — Inpatient Hospital Stay (HOSPITAL_COMMUNITY): Payer: Medicare PPO | Admitting: Anesthesiology

## 2014-04-23 ENCOUNTER — Encounter (HOSPITAL_COMMUNITY): Payer: Self-pay | Admitting: Certified Registered Nurse Anesthetist

## 2014-04-23 ENCOUNTER — Inpatient Hospital Stay (HOSPITAL_COMMUNITY): Payer: Medicare PPO

## 2014-04-23 DIAGNOSIS — W19XXXD Unspecified fall, subsequent encounter: Secondary | ICD-10-CM

## 2014-04-23 DIAGNOSIS — I10 Essential (primary) hypertension: Secondary | ICD-10-CM

## 2014-04-23 DIAGNOSIS — W19XXXA Unspecified fall, initial encounter: Secondary | ICD-10-CM | POA: Insufficient documentation

## 2014-04-23 DIAGNOSIS — F0390 Unspecified dementia without behavioral disturbance: Secondary | ICD-10-CM

## 2014-04-23 HISTORY — PX: INTRAMEDULLARY (IM) NAIL INTERTROCHANTERIC: SHX5875

## 2014-04-23 LAB — COMPREHENSIVE METABOLIC PANEL
ALT: 10 U/L (ref 0–53)
ANION GAP: 3 — AB (ref 5–15)
AST: 26 U/L (ref 0–37)
Albumin: 2.9 g/dL — ABNORMAL LOW (ref 3.5–5.2)
Alkaline Phosphatase: 37 U/L — ABNORMAL LOW (ref 39–117)
BILIRUBIN TOTAL: 1.2 mg/dL (ref 0.3–1.2)
BUN: 13 mg/dL (ref 6–23)
CALCIUM: 8.4 mg/dL (ref 8.4–10.5)
CHLORIDE: 107 meq/L (ref 96–112)
CO2: 27 mmol/L (ref 19–32)
Creatinine, Ser: 0.96 mg/dL (ref 0.50–1.35)
GFR calc non Af Amer: 68 mL/min — ABNORMAL LOW (ref >90)
GFR, EST AFRICAN AMERICAN: 79 mL/min — AB (ref >90)
GLUCOSE: 125 mg/dL — AB (ref 70–99)
POTASSIUM: 4.7 mmol/L (ref 3.5–5.1)
Sodium: 137 mmol/L (ref 135–145)
Total Protein: 5.1 g/dL — ABNORMAL LOW (ref 6.0–8.3)

## 2014-04-23 LAB — CBC WITH DIFFERENTIAL/PLATELET
BASOS ABS: 0 10*3/uL (ref 0.0–0.1)
BASOS PCT: 0 % (ref 0–1)
EOS ABS: 0 10*3/uL (ref 0.0–0.7)
Eosinophils Relative: 1 % (ref 0–5)
HEMATOCRIT: 24.5 % — AB (ref 39.0–52.0)
Hemoglobin: 7.8 g/dL — ABNORMAL LOW (ref 13.0–17.0)
LYMPHS ABS: 0.8 10*3/uL (ref 0.7–4.0)
LYMPHS PCT: 12 % (ref 12–46)
MCH: 27.1 pg (ref 26.0–34.0)
MCHC: 31.8 g/dL (ref 30.0–36.0)
MCV: 85.1 fL (ref 78.0–100.0)
MONOS PCT: 15 % — AB (ref 3–12)
Monocytes Absolute: 0.9 10*3/uL (ref 0.1–1.0)
Neutro Abs: 4.7 10*3/uL (ref 1.7–7.7)
Neutrophils Relative %: 72 % (ref 43–77)
PLATELETS: 111 10*3/uL — AB (ref 150–400)
RBC: 2.88 MIL/uL — ABNORMAL LOW (ref 4.22–5.81)
RDW: 19.7 % — AB (ref 11.5–15.5)
WBC: 6.4 10*3/uL (ref 4.0–10.5)

## 2014-04-23 LAB — PREPARE RBC (CROSSMATCH)

## 2014-04-23 SURGERY — FIXATION, FRACTURE, INTERTROCHANTERIC, WITH INTRAMEDULLARY ROD
Anesthesia: General | Site: Hip | Laterality: Left

## 2014-04-23 MED ORDER — PHENYLEPHRINE 40 MCG/ML (10ML) SYRINGE FOR IV PUSH (FOR BLOOD PRESSURE SUPPORT)
PREFILLED_SYRINGE | INTRAVENOUS | Status: AC
  Filled 2014-04-23: qty 10

## 2014-04-23 MED ORDER — ASPIRIN EC 325 MG PO TBEC: 325.0000 mg | DELAYED_RELEASE_TABLET | Freq: Every day | ORAL | Status: DC

## 2014-04-23 MED ORDER — SODIUM CHLORIDE 0.9 % IV SOLN
INTRAVENOUS | Status: DC | PRN
  Administered 2014-04-23: 17:00:00 via INTRAVENOUS

## 2014-04-23 MED ORDER — GLYCOPYRROLATE 0.2 MG/ML IJ SOLN
INTRAMUSCULAR | Status: AC
  Filled 2014-04-23: qty 2

## 2014-04-23 MED ORDER — LACTATED RINGERS IV SOLN
INTRAVENOUS | Status: DC | PRN
  Administered 2014-04-23: 15:00:00 via INTRAVENOUS

## 2014-04-23 MED ORDER — PHENYLEPHRINE HCL 10 MG/ML IJ SOLN
10.0000 mg | INTRAVENOUS | Status: DC | PRN
  Administered 2014-04-23: 20 ug/min via INTRAVENOUS

## 2014-04-23 MED ORDER — PROPOFOL 10 MG/ML IV BOLUS
INTRAVENOUS | Status: DC | PRN
  Administered 2014-04-23: 100 mg via INTRAVENOUS

## 2014-04-23 MED ORDER — SODIUM CHLORIDE 0.9 % IV SOLN: Freq: Once | INTRAVENOUS | Status: DC

## 2014-04-23 MED ORDER — FENTANYL CITRATE 0.05 MG/ML IJ SOLN
INTRAMUSCULAR | Status: AC
Start: 2014-04-23 — End: 2014-04-23
  Filled 2014-04-23: qty 5

## 2014-04-23 MED ORDER — CEFAZOLIN SODIUM-DEXTROSE 2-3 GM-% IV SOLR
INTRAVENOUS | Status: DC | PRN
  Administered 2014-04-23: 2 g via INTRAVENOUS

## 2014-04-23 MED ORDER — NEOSTIGMINE METHYLSULFATE 10 MG/10ML IV SOLN
INTRAVENOUS | Status: AC
  Filled 2014-04-23: qty 1

## 2014-04-23 MED ORDER — CEFAZOLIN SODIUM-DEXTROSE 2-3 GM-% IV SOLR
INTRAVENOUS | Status: AC
  Filled 2014-04-23: qty 50

## 2014-04-23 MED ORDER — ONDANSETRON HCL 4 MG/2ML IJ SOLN
INTRAMUSCULAR | Status: AC
  Filled 2014-04-23: qty 2

## 2014-04-23 MED ORDER — MEPERIDINE HCL 25 MG/ML IJ SOLN: 6.2500 mg | INTRAMUSCULAR | Status: DC | PRN

## 2014-04-23 MED ORDER — GLYCOPYRROLATE 0.2 MG/ML IJ SOLN
INTRAMUSCULAR | Status: DC | PRN
  Administered 2014-04-23: 0.4 mg via INTRAVENOUS

## 2014-04-23 MED ORDER — NEOSTIGMINE METHYLSULFATE 10 MG/10ML IV SOLN
INTRAVENOUS | Status: DC | PRN
  Administered 2014-04-23: 3 mg via INTRAVENOUS

## 2014-04-23 MED ORDER — CEFAZOLIN SODIUM-DEXTROSE 2-3 GM-% IV SOLR
2.0000 g | Freq: Four times a day (QID) | INTRAVENOUS | Status: AC
  Administered 2014-04-23 – 2014-04-24 (×2): 2 g via INTRAVENOUS
  Filled 2014-04-23 (×2): qty 50

## 2014-04-23 MED ORDER — FENTANYL CITRATE 0.05 MG/ML IJ SOLN
INTRAMUSCULAR | Status: DC | PRN
Start: 2014-04-23 — End: 2014-04-23
  Administered 2014-04-23: 25 ug via INTRAVENOUS

## 2014-04-23 MED ORDER — HYDROCODONE-ACETAMINOPHEN 5-325 MG PO TABS: 1.0000 | ORAL_TABLET | Freq: Three times a day (TID) | ORAL | Status: DC | PRN

## 2014-04-23 MED ORDER — ROCURONIUM BROMIDE 100 MG/10ML IV SOLN
INTRAVENOUS | Status: DC | PRN
  Administered 2014-04-23: 20 mg via INTRAVENOUS

## 2014-04-23 MED ORDER — LIDOCAINE HCL (CARDIAC) 20 MG/ML IV SOLN
INTRAVENOUS | Status: DC | PRN
  Administered 2014-04-23: 50 mg via INTRAVENOUS

## 2014-04-23 MED ORDER — BUPIVACAINE HCL (PF) 0.5 % IJ SOLN
INTRAMUSCULAR | Status: DC | PRN
  Administered 2014-04-23: 20 mL

## 2014-04-23 MED ORDER — PROMETHAZINE HCL 25 MG/ML IJ SOLN: 6.2500 mg | INTRAMUSCULAR | Status: DC | PRN

## 2014-04-23 MED ORDER — 0.9 % SODIUM CHLORIDE (POUR BTL) OPTIME
TOPICAL | Status: DC | PRN
Start: 2014-04-23 — End: 2014-04-23
  Administered 2014-04-23: 1000 mL

## 2014-04-23 MED ORDER — EPHEDRINE SULFATE 50 MG/ML IJ SOLN
INTRAMUSCULAR | Status: DC | PRN
  Administered 2014-04-23 (×2): 10 mg via INTRAVENOUS
  Administered 2014-04-23 (×2): 5 mg via INTRAVENOUS
  Administered 2014-04-23 (×2): 10 mg via INTRAVENOUS

## 2014-04-23 MED ORDER — MORPHINE SULFATE 2 MG/ML IJ SOLN: 1.0000 mg | INTRAMUSCULAR | Status: DC | PRN

## 2014-04-23 MED ORDER — ONDANSETRON HCL 4 MG/2ML IJ SOLN
INTRAMUSCULAR | Status: DC | PRN
  Administered 2014-04-23: 4 mg via INTRAVENOUS

## 2014-04-23 MED ORDER — ROCURONIUM BROMIDE 50 MG/5ML IV SOLN
INTRAVENOUS | Status: AC
  Filled 2014-04-23: qty 1

## 2014-04-23 SURGICAL SUPPLY — 55 items
BIT DRILL 4.3MMS DISTAL GRDTED (BIT) ×1 IMPLANT
BLADE SURG ROTATE 9660 (MISCELLANEOUS) ×3 IMPLANT
BNDG GAUZE ELAST 4 BULKY (GAUZE/BANDAGES/DRESSINGS) ×3 IMPLANT
CORTICAL BONE SCR 5.0MM X 48MM (Screw) ×3 IMPLANT
COVER MAYO STAND STRL (DRAPES) ×3 IMPLANT
COVER PERINEAL POST (MISCELLANEOUS) ×3 IMPLANT
COVER SURGICAL LIGHT HANDLE (MISCELLANEOUS) ×6 IMPLANT
DECANTER SPIKE VIAL GLASS SM (MISCELLANEOUS) ×3 IMPLANT
DRAPE INCISE 23X17 IOBAN STRL (DRAPES) ×2
DRAPE INCISE IOBAN 23X17 STRL (DRAPES) ×1 IMPLANT
DRAPE STERI IOBAN 125X83 (DRAPES) ×3 IMPLANT
DRILL 4.3MMS DISTAL GRADUATED (BIT) ×3
DRSG MEPILEX BORDER 4X4 (GAUZE/BANDAGES/DRESSINGS) ×12 IMPLANT
DRSG MEPILEX BORDER 4X8 (GAUZE/BANDAGES/DRESSINGS) ×3 IMPLANT
DURAPREP 26ML APPLICATOR (WOUND CARE) ×3 IMPLANT
ELECT CAUTERY BLADE 6.4 (BLADE) ×3 IMPLANT
ELECT REM PT RETURN 9FT ADLT (ELECTROSURGICAL) ×3
ELECTRODE REM PT RTRN 9FT ADLT (ELECTROSURGICAL) ×1 IMPLANT
EVACUATOR 1/8 PVC DRAIN (DRAIN) IMPLANT
GAUZE XEROFORM 5X9 LF (GAUZE/BANDAGES/DRESSINGS) ×3 IMPLANT
GLOVE BIOGEL PI IND STRL 7.0 (GLOVE) ×1 IMPLANT
GLOVE BIOGEL PI IND STRL 7.5 (GLOVE) ×1 IMPLANT
GLOVE BIOGEL PI IND STRL 8 (GLOVE) ×2 IMPLANT
GLOVE BIOGEL PI INDICATOR 7.0 (GLOVE) ×2
GLOVE BIOGEL PI INDICATOR 7.5 (GLOVE) ×2
GLOVE BIOGEL PI INDICATOR 8 (GLOVE) ×4
GLOVE ECLIPSE 7.5 STRL STRAW (GLOVE) ×6 IMPLANT
GLOVE SURG SS PI 7.5 STRL IVOR (GLOVE) ×3 IMPLANT
GOWN STRL REUS W/ TWL LRG LVL3 (GOWN DISPOSABLE) ×1 IMPLANT
GOWN STRL REUS W/ TWL XL LVL3 (GOWN DISPOSABLE) ×2 IMPLANT
GOWN STRL REUS W/TWL LRG LVL3 (GOWN DISPOSABLE) ×2
GOWN STRL REUS W/TWL XL LVL3 (GOWN DISPOSABLE) ×4
GUIDEWIRE BALL NOSE 100CM (WIRE) ×3 IMPLANT
HIP FRAC NAIL LAG SCR 10.5X100 (Orthopedic Implant) ×2 IMPLANT
KIT BASIN OR (CUSTOM PROCEDURE TRAY) ×3 IMPLANT
KIT ROOM TURNOVER OR (KITS) ×3 IMPLANT
LINER BOOT UNIVERSAL DISP (MISCELLANEOUS) IMPLANT
MANIFOLD NEPTUNE II (INSTRUMENTS) ×3 IMPLANT
NAIL HIP FX 11MMX42CM (Nail) ×3 IMPLANT
NEEDLE HYPO 25GX1X1/2 BEV (NEEDLE) ×3 IMPLANT
NS IRRIG 1000ML POUR BTL (IV SOLUTION) ×3 IMPLANT
PACK GENERAL/GYN (CUSTOM PROCEDURE TRAY) ×3 IMPLANT
PAD ARMBOARD 7.5X6 YLW CONV (MISCELLANEOUS) ×6 IMPLANT
PIN GUIDE 3.2 903003004 (MISCELLANEOUS) ×6 IMPLANT
SCREW BONE CORTICAL 5.0X52 (Screw) ×3 IMPLANT
SCREW CANN THRD AFF 10.5X100 (Orthopedic Implant) ×1 IMPLANT
SCREW CORTICL BON 5.0MM X 48MM (Screw) ×1 IMPLANT
STAPLER VISISTAT 35W (STAPLE) ×3 IMPLANT
SUT VIC AB 0 CTB1 27 (SUTURE) ×6 IMPLANT
SUT VIC AB 1 CTB1 27 (SUTURE) ×3 IMPLANT
SUT VIC AB 2-0 CTB1 (SUTURE) ×3 IMPLANT
SYR CONTROL 10ML LL (SYRINGE) ×3 IMPLANT
TOWEL OR 17X24 6PK STRL BLUE (TOWEL DISPOSABLE) ×3 IMPLANT
TOWEL OR 17X26 10 PK STRL BLUE (TOWEL DISPOSABLE) ×3 IMPLANT
WATER STERILE IRR 1000ML POUR (IV SOLUTION) ×3 IMPLANT

## 2014-04-23 NOTE — Anesthesia Preprocedure Evaluation (Addendum)
Anesthesia Evaluation  Patient identified by MRN, date of birth, ID band Patient awake    Reviewed: Allergy & Precautions, NPO status , Patient's Chart, lab work & pertinent test results, reviewed documented beta blocker date and time   Airway Mallampati: II  TM Distance: >3 FB Neck ROM: Full    Dental no notable dental hx. (+) Dental Advisory Given   Pulmonary pneumonia -, resolved, former smoker,  breath sounds clear to auscultation  Pulmonary exam normal       Cardiovascular hypertension, Pt. on medications and Pt. on home beta blockers + CAD and + Cardiac Stents Rhythm:Regular Rate:Normal  Echo 04/22/2014 - Left ventricle: Very difficult acoustic windows as patient islimited with movement in bed. Apical windows are foreshortened.LVEF is probably mildly decreased with basal inferiorhypokinesis. - Mitral valve: There was mild regurgitation.   Neuro/Psych    GI/Hepatic GERD-  Medicated,  Endo/Other    Renal/GU      Musculoskeletal   Abdominal   Peds  Hematology  (+) Blood dyscrasia, anemia ,   Anesthesia Other Findings   Reproductive/Obstetrics                          Anesthesia Physical Anesthesia Plan  ASA: III  Anesthesia Plan: General   Post-op Pain Management:    Induction: Intravenous  Airway Management Planned: Oral ETT and Video Laryngoscope Planned  Additional Equipment:   Intra-op Plan:   Post-operative Plan: Extubation in OR  Informed Consent: I have reviewed the patients History and Physical, chart, labs and discussed the procedure including the risks, benefits and alternatives for the proposed anesthesia with the patient or authorized representative who has indicated his/her understanding and acceptance.   Dental advisory given  Plan Discussed with: CRNA, Anesthesiologist and Surgeon  Anesthesia Plan Comments:        Anesthesia Quick Evaluation

## 2014-04-23 NOTE — Progress Notes (Signed)
Subjective: Persistent l hip fracture   Objective: Vital signs in last 24 hours: Temp:  [97.6 F (36.4 C)-98.5 F (36.9 C)] 98.5 F (36.9 C) (01/22 0557) Pulse Rate:  [57-157] 77 (01/22 1137) Resp:  [18-21] 18 (01/22 0557) BP: (117-162)/(51-85) 150/85 mmHg (01/22 1137) SpO2:  [94 %-100 %] 100 % (01/22 0557)  Intake/Output from previous day: 01/21 0701 - 01/22 0700 In: 689 [P.O.:120; I.V.:569] Out: 1400 [Urine:1400] Intake/Output this shift:     Recent Labs  04/21/14 2333 04/23/14 0601  HGB 10.1* 7.8*    Recent Labs  04/21/14 2333 04/23/14 0601  WBC 9.4 6.4  RBC 3.75* 2.88*  HCT 31.6* 24.5*  PLT 113* 111*    Recent Labs  04/21/14 2333 04/23/14 0601  NA 137 137  K 3.8 4.7  CL 106 107  CO2 23 27  BUN 20 13  CREATININE 1.26 0.96  GLUCOSE 167* 125*  CALCIUM 9.0 8.4    Recent Labs  04/21/14 2333  INR 0.98    Neurologically intact ABD soft Neurovascular intact Sensation intact distally Intact pulses distally No cellulitis present Compartment soft  Assessment/Plan: l intertroch fracture in patient cleared for surgery.// Plan im rod for l hip todayj   Marieann Zipp L 04/23/2014, 12:52 PM

## 2014-04-23 NOTE — Transfer of Care (Signed)
Immediate Anesthesia Transfer of Care Note  Patient: Jordan Little  Procedure(s) Performed: Procedure(s): LEFT INTRAMEDULLARY (IM) NAIL INTERTROCHANTRIC  (Left)  Patient Location: PACU  Anesthesia Type:General  Level of Consciousness: awake and alert   Airway & Oxygen Therapy: Patient Spontanous Breathing and Patient connected to nasal cannula oxygen  Post-op Assessment: Report given to PACU RN, Post -op Vital signs reviewed and stable and Patient moving all extremities X 4  Post vital signs: Reviewed and stable  Complications: No apparent anesthesia complications

## 2014-04-23 NOTE — Anesthesia Procedure Notes (Signed)
Procedure Name: Intubation Date/Time: 04/23/2014 4:26 PM Performed by: Garrison Columbus T Pre-anesthesia Checklist: Patient identified, Emergency Drugs available, Suction available and Patient being monitored Patient Re-evaluated:Patient Re-evaluated prior to inductionOxygen Delivery Method: Circle system utilized Preoxygenation: Pre-oxygenation with 100% oxygen Intubation Type: IV induction Ventilation: Mask ventilation without difficulty Laryngoscope Size: Glidescope and 4 Grade View: Grade I Tube type: Oral Tube size: 7.5 mm Number of attempts: 1 Airway Equipment and Method: Stylet Placement Confirmation: ETT inserted through vocal cords under direct vision,  positive ETCO2 and breath sounds checked- equal and bilateral Secured at: 22 cm Tube secured with: Tape Dental Injury: Teeth and Oropharynx as per pre-operative assessment

## 2014-04-23 NOTE — Progress Notes (Signed)
[facility] PROGRESS NOTE   [name] EQA:[id number] DOB: 09/17/1915 DOA: 04/21/2014 PCP: [name], MD  HPI: Jordan Little is a 81 y.o. male with history of CAD, hyperlipidemia, hypertension, and dementia who suffered a mechanical fall 3-5 hours ago in his bathroom at his NH. Landed on L hip. Now has L hip pain, inability to bear weight. Pain is worse with movement, better a rest. Pain is severe. X ray confirms fracture.  Subjective: Seen with cerebellar side, complained about some pain in the left hip  Assessment/Plan: Principal Problem:   Closed left hip fracture Active Problems:   Essential hypertension   Elevated troponin   Dementia without behavioral disturbance   Closed Left Hip Fracture -Unattended fall in nursing home bathroom 04/21/2014, unclear etiology -Considering cardiac etiology d/t history of CAD and elevated troponins -CXR and CT head w/o contrast showed no acute findings -Left hip xray showed intertrochanteric comminuted left proximal femur fracture -Left elbow xray was negative for acute findings. -Telemetry -Patient is for surgery today, per cardiology at least moderate risk for surgery, no other intervention might decrease his risk. -Metoprolol added for atrial ectopy and tachycardia.  Elevated Troponins -Pt has history of coronary artery disease -Serial troponins: 0.49, 0.48, 0.32 -Elevated troponin is likely secondary to tachycardia, no chest pain. Metoprolol and aspirin added.  Acute Urinary Retention -Pt reports needing to void and feeling lower abdominal discomfort -possibly related to prostate history -Output 100 mL -Bladder scan showed 595 mL -Foley catheter placed.  Dementia -Patient is awake alert oriented has trouble remembering events.  Code Status: DNR Family Communication: Plan discussed with the patient. Disposition Plan: Remains inpatient Diet: Diet NPO time  specified  Consultants:  Cardiology  Orthopedics  Procedures:  Open reduction internal fixation planned for 04/23/2014  Antibiotics:  None   Objective: Filed Vitals:   04/23/14 1137  BP: 150/85  Pulse: 77  Temp:   Resp:     Intake/Output Summary (Last 24 hours) at 04/23/14 1344 Last data filed at 04/23/14 0604  Gross per 24 hour  Intake    689 ml  Output    650 ml  Net     39 ml   Filed Weights   04/22/14 0300  Weight: 66.452 kg (146 lb 8 oz)    Exam: General: Alert and awake, oriented x 1, not in any acute distress. HEENT: anicteric sclera, pupils reactive to light, EOMI, MMM, no lymphadenopathy or masses CVS: irregular heart rate; S1-S2 clear, no murmur rubs or gallops; pedal pulses 3+ bilaterally Chest: clear to auscultation bilaterally, no wheezing, rales or rhonchi Abdomen: nondistended, normal bowel sounds, mild tenderness at lower abdomen, bladder palpable, otherwise soft; bowel sounds present Extremities: no cyanosis, clubbing or edema noted bilaterally; 5/5 strength bilateraly in upper and lower extremities.  Data Reviewed: Basic Metabolic Panel:  Recent Labs Lab 04/21/14 2333 04/23/14 0601  NA 137 137  K 3.8 4.7  CL 106 107  CO2 23 27  GLUCOSE 167* 125*  BUN 20 13  CREATININE 1.26 0.96  CALCIUM 9.0 8.4   Liver Function Tests:  Recent Labs Lab 04/21/14 2333 04/23/14 0601  AST 26 26  ALT 13 10  ALKPHOS 49 37*  BILITOT 0.7 1.2  PROT 6.3 5.1*  ALBUMIN 3.6 2.9*   No results for input(s): LIPASE, AMYLASE in the last 168 hours. No results for input(s): AMMONIA in the last 168 hours. CBC:  Recent Labs Lab 04/21/14 2333 04/23/14 0601  WBC 9.4 6.4  NEUTROABS 7.4  4.7  HGB 10.1* 7.8*  HCT 31.6* 24.5*  MCV 84.3 85.1  PLT 113* 111*   Cardiac Enzymes:  Recent Labs Lab 04/21/14 2333 04/22/14 0137 04/22/14 0710 04/22/14 1300  TROPONINI 0.49* 0.48* 0.32* 0.33*   BNP (last 3 results) No results for input(s): PROBNP in the last  8760 hours. CBG: No results for input(s): GLUCAP in the last 168 hours.  Micro Recent Results (from the past 240 hour(s))  Surgical pcr screen     Status: None   Collection Time: 04/22/14  4:25 AM  Result Value Ref Range Status   MRSA, PCR NEGATIVE NEGATIVE Final   Staphylococcus aureus NEGATIVE NEGATIVE Final    Comment:        The Xpert SA Assay (FDA approved for NASAL specimens in patients over 50 years of age), is one component of a comprehensive surveillance program.  Test performance has been validated by [facility] for patients greater than or equal to 73 year old. It is not intended to diagnose infection nor to guide or monitor treatment.      Studies: Dg Chest 1 View  04/22/2014   CLINICAL DATA:  Fall with left hip pain.  EXAM: CHEST - 1 VIEW  COMPARISON:  05/19/2009  FINDINGS: Cardiac silhouette top-normal in size. Aorta is uncoiled. No mediastinal or hilar masses. Lungs are clear. No pleural effusion or pneumothorax.  Bony thorax is diffusely demineralized but grossly intact.  IMPRESSION: No acute cardiopulmonary disease.   Electronically Signed   By: Lajean Manes M.D.   On: 04/22/2014 00:25   Dg Elbow 2 Views Left  04/22/2014   CLINICAL DATA:  Fall with left elbow pain.  Initial encounter.  EXAM: LEFT ELBOW - 2 VIEW  COMPARISON:  None.  FINDINGS: No evidence of joint effusion, fracture, or dislocation. No acute soft tissue findings.  IMPRESSION: Negative.   Electronically Signed   By: Jorje Guild M.D.   On: 04/22/2014 02:52   Ct Head Wo Contrast  04/22/2014   CLINICAL DATA:  Per EMS, the patient is from [facility] in [place], and fell tonight trying to use the restroom. He typically uses a walker, the son reports. Fall was around 7:50 tonight, with confirmed x-ray of left hip at the facility at 2030, not present with paperwork on arrival. 100 mcg of fentanyl given by ems, for pain management, side effects of confusion and a-fib with abberancy on ekg.  EXAM: CT  HEAD WITHOUT CONTRAST  TECHNIQUE: Contiguous axial images were obtained from the base of the skull through the vertex without intravenous contrast.  COMPARISON:  12/13/2013  FINDINGS: Ventricles normal configuration. There is ventricular and sulcal enlargement reflecting a moderate atrophy. No hydrocephalus.  No parenchymal masses or mass effect. There is no evidence of a a cortical infarct.  Patchy areas of white matter hypoattenuation are noted, stable, consistent with moderate chronic microvascular ischemic change.  No extra-axial masses or abnormal fluid collections.  There is no intracranial hemorrhage.  Fluid level in the right sphenoid sinus, stable from the prior exam. Remaining sinuses are clear as are the mastoid air cells. No skull fracture.  IMPRESSION: 1. No acute intracranial abnormalities. 2. Atrophy and chronic microvascular ischemic change, stable.   Electronically Signed   By: Lajean Manes M.D.   On: 04/22/2014 00:09   Dg Hip Unilat With Pelvis 2-3 Views Left  04/21/2014   CLINICAL DATA:  Fall tonight.  Left hip pain.  EXAM: DG HIP W/ PELVIS 2-3V*L*  COMPARISON:  None.  FINDINGS: There is an intertrochanteric fracture of the proximal left femur. There are greater and lesser trochanteric fracture fragments. The primary fracture components are mildly displaced, 2.4 cm, with the inferior neck fracture component overlapped on the metaphyseal component. There is varus angulation.  No other fractures. No dislocation. Bones are extensively demineralized. There are dense calcifications along the femoral vessels.  IMPRESSION: Mildly displaced intertrochanteric comminuted left proximal femur fracture with varus angulation.   Electronically Signed   By: Lajean Manes M.D.   On: 04/21/2014 23:59    Scheduled Meds: . sodium chloride   Intravenous Once  . acetaminophen  500 mg Oral TID  . aspirin EC  325 mg Oral Daily  . feeding supplement (ENSURE COMPLETE)  237 mL Oral BID BM  . iron polysaccharides   150 mg Oral Daily  . isosorbide mononitrate  60 mg Oral Daily  . lisinopril  10 mg Oral BID  . metoprolol succinate  50 mg Oral Daily  . metoprolol tartrate  25 mg Oral 4 times per day  . oxybutynin  10 mg Oral Daily  . pantoprazole  40 mg Oral Daily  . vitamin B-12  1,000 mcg Oral Daily   Continuous Infusions: . sodium chloride 75 mL/hr at 04/22/14 1507       Time spent: 35 minutes    Keaja Reaume A, PA-S  [facility] Pager [phone] If 7PM-7AM, please contact night-coverage at [email or web address], password [facility] 04/23/2014, 1:44 PM  LOS: 2 days       Addendum  Patient seen and examined, chart and data base reviewed.  I agree with the above assessment and plan.  For full details please see Mrs. Magda Bernheim, PA-S note.  I reviewed and amended the above note as appropriate.   Birdie Hopes, MD [facility] Pager: [phone] 04/23/2014, 1:44 PM

## 2014-04-23 NOTE — Brief Op Note (Signed)
04/21/2014 - 04/23/2014  5:44 PM  PATIENT:  Jordan Little  81 y.o. male  PRE-OPERATIVE DIAGNOSIS:  Left Hip intertroch fracture  POST-OPERATIVE DIAGNOSIS:  Left Hip intertroch fracture  PROCEDURE:  Procedure(s): LEFT INTRAMEDULLARY (IM) NAIL INTERTROCHANTRIC  (Left)  SURGEON:  Surgeon(s) and Role:    * Alta Corning, MD - Primary  PHYSICIAN ASSISTANT:   ASSISTANTS: bethune   ANESTHESIA:   general  EBL:  Total I/O In: 7915 [I.V.:800; Blood:670] Out: 475 [Urine:375; Blood:100]  BLOOD ADMINISTERED:2 CC PRBC  DRAINS: none   LOCAL MEDICATIONS USED:  MARCAINE     SPECIMEN:  No Specimen  DISPOSITION OF SPECIMEN:  N/A  COUNTS:  YES  TOURNIQUET:  * No tourniquets in log *  DICTATION: .Other Dictation: Dictation Number [id number]  PLAN OF CARE: Admit to inpatient   PATIENT DISPOSITION:  PACU - hemodynamically stable.   Delay start of Pharmacological VTE agent (>24hrs) due to surgical blood loss or risk of bleeding: no

## 2014-04-23 NOTE — Progress Notes (Signed)
SUBJECTIVE:  No chest pain. No SOB   PHYSICAL EXAM Filed Vitals:   04/23/14 0536 04/23/14 0557 04/23/14 1135 04/23/14 1137  BP:  138/53 150/85 150/85  Pulse: 68 57  77  Temp:  98.5 F (36.9 C)    TempSrc:  Axillary    Resp:  18    Height:      Weight:      SpO2:  100%     General:  No distress Lungs:  Clear Heart:  Irregular Abdomen:  Positive bowel sounds, no rebound no guarding Extremities:  No edema  LABS: Lab Results  Component Value Date   TROPONINI 0.33* 04/22/2014   Results for orders placed or performed during the hospital encounter of 04/21/14 (from the past 24 hour(s))  Troponin I (q 6hr x 3)     Status: Abnormal   Collection Time: 04/22/14  1:00 PM  Result Value Ref Range   Troponin I 0.33 (H) <0.031 ng/mL  CBC with Differential     Status: Abnormal   Collection Time: 04/23/14  6:01 AM  Result Value Ref Range   WBC 6.4 4.0 - 10.5 K/uL   RBC 2.88 (L) 4.22 - 5.81 MIL/uL   Hemoglobin 7.8 (L) 13.0 - 17.0 g/dL   HCT 24.5 (L) 39.0 - 52.0 %   MCV 85.1 78.0 - 100.0 fL   MCH 27.1 26.0 - 34.0 pg   MCHC 31.8 30.0 - 36.0 g/dL   RDW 19.7 (H) 11.5 - 15.5 %   Platelets 111 (L) 150 - 400 K/uL   Neutrophils Relative % 72 43 - 77 %   Neutro Abs 4.7 1.7 - 7.7 K/uL   Lymphocytes Relative 12 12 - 46 %   Lymphs Abs 0.8 0.7 - 4.0 K/uL   Monocytes Relative 15 (H) 3 - 12 %   Monocytes Absolute 0.9 0.1 - 1.0 K/uL   Eosinophils Relative 1 0 - 5 %   Eosinophils Absolute 0.0 0.0 - 0.7 K/uL   Basophils Relative 0 0 - 1 %   Basophils Absolute 0.0 0.0 - 0.1 K/uL  Comprehensive metabolic panel     Status: Abnormal   Collection Time: 04/23/14  6:01 AM  Result Value Ref Range   Sodium 137 135 - 145 mmol/L   Potassium 4.7 3.5 - 5.1 mmol/L   Chloride 107 96 - 112 mEq/L   CO2 27 19 - 32 mmol/L   Glucose, Bld 125 (H) 70 - 99 mg/dL   BUN 13 6 - 23 mg/dL   Creatinine, Ser 0.96 0.50 - 1.35 mg/dL   Calcium 8.4 8.4 - 10.5 mg/dL   Total Protein 5.1 (L) 6.0 - 8.3 g/dL   Albumin  2.9 (L) 3.5 - 5.2 g/dL   AST 26 0 - 37 U/L   ALT 10 0 - 53 U/L   Alkaline Phosphatase 37 (L) 39 - 117 U/L   Total Bilirubin 1.2 0.3 - 1.2 mg/dL   GFR calc non Af Amer 68 (L) >90 mL/min   GFR calc Af Amer 79 (L) >90 mL/min   Anion gap 3 (L) 5 - 15    Intake/Output Summary (Last 24 hours) at 04/23/14 1157 Last data filed at 04/23/14 0604  Gross per 24 hour  Intake    689 ml  Output   1100 ml  Net   -411 ml     ASSESSMENT AND PLAN:  PREOP:  Per Dr. Harrington Challenger.  No further cardiac testing.  We will follow.  ELEVATED TROPONIN:  Trend has been down and he has no acute symptoms.  We will follow this clinically.  Suspect that this is demand ischemia with anemia and pain and his fall.   ARRHYTHMIA:  No atrial fib on telemetry.  No change in therapy.  Continue telemetry post op.    Jeneen Rinks [facility] 04/23/2014 11:57 AM

## 2014-04-24 LAB — BASIC METABOLIC PANEL
ANION GAP: 7 (ref 5–15)
BUN: 12 mg/dL (ref 6–23)
CALCIUM: 8 mg/dL — AB (ref 8.4–10.5)
CO2: 22 mmol/L (ref 19–32)
Chloride: 109 mmol/L (ref 96–112)
Creatinine, Ser: 0.97 mg/dL (ref 0.50–1.35)
GFR calc Af Amer: 78 mL/min — ABNORMAL LOW (ref >90)
GFR, EST NON AFRICAN AMERICAN: 67 mL/min — AB (ref >90)
GLUCOSE: 139 mg/dL — AB (ref 70–99)
Potassium: 4.1 mmol/L (ref 3.5–5.1)
Sodium: 138 mmol/L (ref 135–145)

## 2014-04-24 LAB — CBC
HCT: 28.3 % — ABNORMAL LOW (ref 39.0–52.0)
HEMOGLOBIN: 9.3 g/dL — AB (ref 13.0–17.0)
MCH: 27.9 pg (ref 26.0–34.0)
MCHC: 32.9 g/dL (ref 30.0–36.0)
MCV: 85 fL (ref 78.0–100.0)
PLATELETS: 72 10*3/uL — AB (ref 150–400)
RBC: 3.33 MIL/uL — ABNORMAL LOW (ref 4.22–5.81)
RDW: 17.9 % — AB (ref 11.5–15.5)
WBC: 8.1 10*3/uL (ref 4.0–10.5)

## 2014-04-24 MED ORDER — TAMSULOSIN HCL 0.4 MG PO CAPS
0.4000 mg | ORAL_CAPSULE | Freq: Every day | ORAL | Status: DC
  Administered 2014-04-24 – 2014-04-25 (×2): 0.4 mg via ORAL
  Filled 2014-04-24 (×3): qty 1

## 2014-04-24 NOTE — Op Note (Signed)
NAMEJAYTHAN, HINELY NO.:  [id number]  MEDICAL RECORD NO.:  [id number]  LOCATION:  6N14C                        FACILITY:  [facility]  PHYSICIAN:  Alta Corning, M.D.   DATE OF BIRTH:  01-26-1916  DATE OF PROCEDURE:  04/23/2014 DATE OF DISCHARGE:                              OPERATIVE REPORT   PREOPERATIVE DIAGNOSIS:  Intertrochanteric hip fracture of the left.  POSTOPERATIVE DIAGNOSIS:  Intertrochanteric hip fracture of the left.  PROCEDURE:  Open reduction of internal fixation of the left intertrochanteric hip fracture with an intramedullary nail 11 x 42, 420 mm with 100 mm lag screw and a distal interlock through the dynamic hole.  SURGEON:  Alta Corning, M.D.  ASSISTANT:  Gary Fleet, PA-C  ANESTHESIA:  General.  BRIEF HISTORY:  Mr. Gang is a 81 year old male with a history of having fall and suffered intertrochanteric hip fracture.  He was admitted and treated initially by the Medicine Service to make sure that he was an adequate candidate for surgery.  Once this was established, he was taken to the operating room for open reduction and internal fixation.  DESCRIPTION OF PROCEDURE:  The patient was taken to the operating room and after adequate level of anesthesia was obtained with general anesthetic, the patient was placed supine on the operating table.  He was then moved onto the fracture table.  On the left leg, he underwent a manipulative closed reduction and was placed in traction.  Once this was done, the opposite leg was placed up in the leg positioner and intraoperative x-rays were taken showing that we had a reasonable reduction.  At this point, he was prepped and draped in usual sterile fashion.  Following this, a small incision was made just proximal to the greater trochanter.  The guidewire was placed through the tip of the greater trochanter, over-reamed, entered, the rod was placed and then the lag screw was placed.  We used a  derotational wire because he had little bit of sag, and we were able to get into the central portion of the head on both the AP and lateral fluoro while we removed the derotational wire.  Once this was done, we compressed them and statically locked him because we had compressed.  We lost a little bit of the angulation, it was unclear, we tried traction and could not get the exact angle back, but I compressed them at least in the inferior portion almost to the rod.  At that point, I felt that we needed to statically lock him.  Distally, we did a single interlock to control rotation and length.  At this point, the wounds were copiously irrigated, suctioned dry, and closed in layers.  Sterile compressive dressing was applied.  The patient was taken to recovery.  He was noted to be in satisfactory condition. Estimated blood loss for the procedure was minimal.  Of note, intraoperatively, we did give him 2 units of packed red blood cells not because he had any bleeding issues, but because he came to the operating room with a hemoglobin of just below 25.     Alta Corning, M.D.     Corliss Skains  D:  04/23/2014  T:  04/24/2014  Job:  [id number]

## 2014-04-24 NOTE — Progress Notes (Signed)
   PATIENT ID: Genevive Bi   1 Day Post-Op Procedure(s) (LRB): LEFT INTRAMEDULLARY (IM) NAIL INTERTROCHANTRIC  (Left)  Subjective: Reports doing well. Sitting up in bed. Minimal left hip pain.   Objective:  Filed Vitals:   04/24/14 0913  BP:   Pulse:   Temp:   Resp: 16     L hip dressing c/d/i Wiggles toes, distally NVI  Labs:   Recent Labs  04/21/14 2333 04/23/14 0601 04/24/14 0350  HGB 10.1* 7.8* 9.3*   Recent Labs  04/23/14 0601 04/24/14 0350  WBC 6.4 8.1  RBC 2.88* 3.33*  HCT 24.5* 28.3*  PLT 111* 72*   Recent Labs  04/23/14 0601 04/24/14 0350  NA 137 138  K 4.7 4.1  CL 107 109  CO2 27 22  BUN 13 12  CREATININE 0.96 0.97  GLUCOSE 125* 139*  CALCIUM 8.4 8.0*    Assessment and Plan: 1 day s/p left IM nail  Touchdown weight bearing Up with PT ASA 325mg  daily for DVT prophylaxis, SCDs Minimize pain rx as much as possible D/c per primary team norco script signed in chart for d/c pain rx

## 2014-04-24 NOTE — Evaluation (Signed)
Physical Therapy Evaluation Patient Details Name: Jordan Little MRN: [id number] DOB: 03-Jun-1915 Today's Date: 04/24/2014   History of Present Illness  Patient is a 81 y/o male s/p IM nailing left hip after fall resulting in left prox femur fx. PMH of prostate ca, CAD, HTN and PNA.   Clinical Impression  Patient presents with baseline cognitive deficits, poor trunk control/sitting balance, pain and weakness. Pt tolerated sitting EOB with Max A-Min guard assist to maintain balance. Lengthy discussion with son about disposition and WB status. Pt will be transferring to SNF at [facility] and would benefit from skilled PT to improve transfers, balance and mobility so pt can ease burden of care and minimize fall risk. Most likely pt will not be able to comprehend and adhere to TDWB status secondary to hx of dementia.    Follow Up Recommendations SNF;Supervision/Assistance - 24 hour    Equipment Recommendations  None recommended by PT    Recommendations for Other Services OT consult     Precautions / Restrictions Precautions Precautions: Fall Restrictions Weight Bearing Restrictions: Yes LLE Weight Bearing: Touchdown weight bearing      Mobility  Bed Mobility Overal bed mobility: Needs Assistance Bed Mobility: Supine to Sit;Sit to Supine     Supine to sit: Max assist;HOB elevated Sit to supine: Max assist;+2 for physical assistance   General bed mobility comments: Max A to bring BLEs, bottom and elevate trunk to EOB. Not able to assist moving LLE. MAx A of 2 to return to supine.   Transfers Overall transfer level:  (Did not assess secondary to poor trunk control in sitting.)                  Ambulation/Gait Ambulation/Gait assistance:  (NA.)              Stairs            Wheelchair Mobility    Modified Rankin (Stroke Patients Only)       Balance Overall balance assessment: Needs assistance;History of Falls Sitting-balance support: Feet  supported;Bilateral upper extremity supported Sitting balance-Leahy Scale: Poor Sitting balance - Comments: Requires Max A-Min guard assist sitting EOB with posterior lean and to the right. Not able to self correct posture/balance with verbal cues. Fatigues quickly. Sat EOB ~13 minutes.  Postural control: Right lateral lean;Posterior lean                                   Pertinent Vitals/Pain Pain Assessment: Faces Faces Pain Scale: Hurts even more Pain Location: left hip Pain Intervention(s): Limited activity within patient's tolerance;Monitored during session;Repositioned    Home Living Family/patient expects to be discharged to:: Skilled nursing facility                 Additional Comments: Pt from [facility] ALF. Will be transferred to Skilled portion of facility at discharge.    Prior Function Level of Independence: Independent with assistive device(s)         Comments: PTA, pt Mod I for ADLs and walked to dining hall with RW. Meals were provided.      Hand Dominance        Extremity/Trunk Assessment   Upper Extremity Assessment: Defer to OT evaluation           Lower Extremity Assessment: Generalized weakness;LLE deficits/detail;Difficult to assess due to impaired cognition RLE Deficits / Details: Grossly ~3/5 throughout hip, knee , ankle. LLE Deficits /  Details: Pt not actively moving LLE even with verbal, manual cues. PROM ankle, knee WFL.      Communication   Communication: HOH  Cognition Arousal/Alertness: Awake/alert Behavior During Therapy: WFL for tasks assessed/performed Overall Cognitive Status: History of cognitive impairments - at baseline                      General Comments      Exercises General Exercises - Lower Extremity Ankle Circles/Pumps: Right;10 reps;AAROM;Left;Seated Long Arc Quad: Right;10 reps;Seated;AAROM;Left Hip Flexion/Marching: Right;10 reps;AAROM;Left;Seated      Assessment/Plan    PT  Assessment Patient needs continued PT services  PT Diagnosis Acute pain;Generalized weakness   PT Problem List Decreased strength;Pain;Decreased range of motion;Decreased cognition;Decreased activity tolerance;Decreased safety awareness;Decreased balance;Decreased mobility  PT Treatment Interventions Balance training;Gait training;Patient/family education;Functional mobility training;Therapeutic activities;Therapeutic exercise;Wheelchair mobility training;DME instruction   PT Goals (Current goals can be found in the Care Plan section) Acute Rehab PT Goals Patient Stated Goal: none stated PT Goal Formulation: Patient unable to participate in goal setting Time For Goal Achievement: 05/08/14 Potential to Achieve Goals: Fair    Frequency Min 3X/week   Barriers to discharge        Co-evaluation               End of Session   Activity Tolerance: Patient limited by pain;Patient limited by fatigue Patient left: in bed;with bed alarm set;with call bell/phone within reach;with family/visitor present (Mitts donned. ) Nurse Communication: Mobility status;Need for lift equipment         Time: 1440-1518 PT Time Calculation (min) (ACUTE ONLY): 38 min   Charges:   PT Evaluation $Initial PT Evaluation Tier I: 1 Procedure PT Treatments $Therapeutic Activity: 8-22 mins $Self Care/Home Management: 8-22   PT G CodesCandy Sledge A 05/05/14, 3:32 PM Candy Sledge, [place], DPT [phone]

## 2014-04-24 NOTE — Progress Notes (Signed)
SUBJECTIVE:  No chest pain. No SOB.  More confused post op than he was yesterday.   PHYSICAL EXAM Filed Vitals:   04/24/14 0000 04/24/14 0052 04/24/14 0400 04/24/14 0451  BP:  126/62  149/53  Pulse:  76  63  Temp:  98.2 F (36.8 C)  98.3 F (36.8 C)  TempSrc:  Axillary  Axillary  Resp: 18 18 20 20   Height:      Weight:      SpO2: 97% 97% 96% 96%   General:  No distress Lungs:  Clear Heart:  Irregular Abdomen:  Positive bowel sounds, no rebound no guarding Extremities:  No edema  LABS:  Results for orders placed or performed during the hospital encounter of 04/21/14 (from the past 24 hour(s))  Prepare RBC     Status: None   Collection Time: 04/23/14  2:20 PM  Result Value Ref Range   Order Confirmation ORDER PROCESSED BY BLOOD BANK   Type and screen     Status: None (Preliminary result)   Collection Time: 04/23/14  2:20 PM  Result Value Ref Range   ABO/RH(D) O POS    Antibody Screen NEG    Sample Expiration 04/26/2014    Unit Number [id number]    Blood Component Type RED CELLS,LR    Unit division 00    Status of Unit ISSUED,FINAL    Transfusion Status OK TO TRANSFUSE    Crossmatch Result Compatible    Unit Number [id number]    Blood Component Type RED CELLS,LR    Unit division 00    Status of Unit ISSUED,FINAL    Transfusion Status OK TO TRANSFUSE    Crossmatch Result Compatible    Unit Number [id number]    Blood Component Type RED CELLS,LR    Unit division 00    Status of Unit ALLOCATED    Transfusion Status OK TO TRANSFUSE    Crossmatch Result Compatible    Unit Number [id number]    Blood Component Type RED CELLS,LR    Unit division 00    Status of Unit ALLOCATED    Transfusion Status OK TO TRANSFUSE    Crossmatch Result Compatible   Prepare RBC     Status: None   Collection Time: 04/23/14  4:42 PM  Result Value Ref Range   Order Confirmation ORDER PROCESSED BY BLOOD BANK   CBC     Status: Abnormal   Collection Time: 04/24/14  3:50  AM  Result Value Ref Range   WBC 8.1 4.0 - 10.5 K/uL   RBC 3.33 (L) 4.22 - 5.81 MIL/uL   Hemoglobin 9.3 (L) 13.0 - 17.0 g/dL   HCT 28.3 (L) 39.0 - 52.0 %   MCV 85.0 78.0 - 100.0 fL   MCH 27.9 26.0 - 34.0 pg   MCHC 32.9 30.0 - 36.0 g/dL   RDW 17.9 (H) 11.5 - 15.5 %   Platelets 72 (L) 150 - 400 K/uL  Basic metabolic panel     Status: Abnormal   Collection Time: 04/24/14  3:50 AM  Result Value Ref Range   Sodium 138 135 - 145 mmol/L   Potassium 4.1 3.5 - 5.1 mmol/L   Chloride 109 96 - 112 mmol/L   CO2 22 19 - 32 mmol/L   Glucose, Bld 139 (H) 70 - 99 mg/dL   BUN 12 6 - 23 mg/dL   Creatinine, Ser 0.97 0.50 - 1.35 mg/dL   Calcium 8.0 (L) 8.4 - 10.5 mg/dL   GFR calc non Af  Amer 67 (L) >90 mL/min   GFR calc Af Amer 78 (L) >90 mL/min   Anion gap 7 5 - 15    Intake/Output Summary (Last 24 hours) at 04/24/14 0824 Last data filed at 04/24/14 0504  Gross per 24 hour  Intake   2398 ml  Output    525 ml  Net   1873 ml     ASSESSMENT AND PLAN:  POST OP:   No apparent acute cardiac complications.    ELEVATED TROPONIN:    Trend was down and he is not having any acute cardiac complaints.  Continue current meds.   ARRHYTHMIA:  I again looked through every rhythm strip and everything that is labled atrial fib is multifocal atrial tachy/PAC with and without aberrancy.  No atrial fib on telemetry.  No change in therapy.  Continue telemetry another day.   Jeneen Rinks [facility] 04/24/2014 8:24 AM

## 2014-04-24 NOTE — Progress Notes (Signed)
[facility] PROGRESS NOTE   Jordan Little:[id number] DOB: [birth year] DOA: 04/21/2014 PCP: [name], MD  HPI: Jordan Little is a 81 y.o. male with history of CAD, hyperlipidemia, hypertension, and dementia who suffered a mechanical fall 3-5 hours ago in his bathroom at his NH. Landed on L hip. Now has L hip pain, inability to bear weight. Pain is worse with movement, better a rest. Pain is severe. X ray confirms fracture.  Subjective: Seen while he was eating his breakfast, confused no other complaints.  Assessment/Plan: Principal Problem:   Closed left hip fracture Active Problems:   Essential hypertension   Elevated troponin   Dementia without behavioral disturbance   Fall   Closed Left Hip Fracture -Unattended fall in nursing home bathroom 04/21/2014, unclear etiology -Considering cardiac etiology d/t history of CAD and elevated troponins -CXR and CT head w/o contrast showed no acute findings -Left hip xray showed intertrochanteric comminuted left proximal femur fracture -Left elbow xray was negative for acute findings. -Telemetry -Toprol-XL added for atrial ectopy and tachycardia. -Status post open reduction internal fixation of the left intertrochanteric hip fracture with IM nail  Elevated Troponins -Pt has history of coronary artery disease -Serial troponins: 0.49, 0.48, 0.32 -Elevated troponin is likely secondary to tachycardia, no chest pain. Metoprolol and aspirin added. -Toprol-XL added, per cardiology continue telemetry for one more day.  Acute Urinary Retention -Pt reports needing to void and feeling lower abdominal discomfort -possibly related to prostate history -Foley catheter placed. I added Flomax and discontinued oxybutynin.  Dementia -Patient is awake alert oriented has trouble remembering events.  Code Status: DNR Family Communication: Plan discussed with the patient. Disposition Plan: Remains inpatient Diet: Diet  regular  Consultants:  Cardiology  Orthopedics  Procedures:  Open reduction of internal fixation of the left intertrochanteric hip fracture with an intramedullary nail 11 x 42, 420 mm with 100 mm lag screw and a distal interlock through the dynamic hole, done by Dr. Berenice Primas.  Antibiotics:  None   Objective: Filed Vitals:   04/24/14 0913  BP:   Pulse:   Temp:   Resp: 16    Intake/Output Summary (Last 24 hours) at 04/24/14 1138 Last data filed at 04/24/14 3790  Gross per 24 hour  Intake   2578 ml  Output    525 ml  Net   2053 ml   Filed Weights   04/22/14 0300  Weight: 66.452 kg (146 lb 8 oz)    Exam: General: Alert and awake, oriented x 1, not in any acute distress. HEENT: anicteric sclera, pupils reactive to light, EOMI, MMM, no lymphadenopathy or masses CVS: irregular heart rate; S1-S2 clear, no murmur rubs or gallops; pedal pulses 3+ bilaterally Chest: clear to auscultation bilaterally, no wheezing, rales or rhonchi Abdomen: nondistended, normal bowel sounds, mild tenderness at lower abdomen, bladder palpable, otherwise soft; bowel sounds present Extremities: no cyanosis, clubbing or edema noted bilaterally; 5/5 strength bilateraly in upper and lower extremities.  Data Reviewed: Basic Metabolic Panel:  Recent Labs Lab 04/21/14 2333 04/23/14 0601 04/24/14 0350  NA 137 137 138  K 3.8 4.7 4.1  CL 106 107 109  CO2 23 27 22   GLUCOSE 167* 125* 139*  BUN 20 13 12   CREATININE 1.26 0.96 0.97  CALCIUM 9.0 8.4 8.0*   Liver Function Tests:  Recent Labs Lab 04/21/14 2333 04/23/14 0601  AST 26 26  ALT 13 10  ALKPHOS 49 37*  BILITOT 0.7 1.2  PROT 6.3 5.1*  ALBUMIN 3.6  2.9*   No results for input(s): LIPASE, AMYLASE in the last 168 hours. No results for input(s): AMMONIA in the last 168 hours. CBC:  Recent Labs Lab 04/21/14 2333 04/23/14 0601 04/24/14 0350  WBC 9.4 6.4 8.1  NEUTROABS 7.4 4.7  --   HGB 10.1* 7.8* 9.3*  HCT 31.6* 24.5* 28.3*   MCV 84.3 85.1 85.0  PLT 113* 111* 72*   Cardiac Enzymes:  Recent Labs Lab 04/21/14 2333 04/22/14 0137 04/22/14 0710 04/22/14 1300  TROPONINI 0.49* 0.48* 0.32* 0.33*   BNP (last 3 results) No results for input(s): PROBNP in the last 8760 hours. CBG: No results for input(s): GLUCAP in the last 168 hours.  Micro Recent Results (from the past 240 hour(s))  Surgical pcr screen     Status: None   Collection Time: 04/22/14  4:25 AM  Result Value Ref Range Status   MRSA, PCR NEGATIVE NEGATIVE Final   Staphylococcus aureus NEGATIVE NEGATIVE Final    Comment:        The Xpert SA Assay (FDA approved for NASAL specimens in patients over 92 years of age), is one component of a comprehensive surveillance program.  Test performance has been validated by [facility] for patients greater than or equal to 14 year old. It is not intended to diagnose infection nor to guide or monitor treatment.      Studies: Dg C-arm 1-60 Min  04/23/2014   CLINICAL DATA:  Left hip intertrochanteric fracture.  EXAM: DG FEMUR 2+V*L*; DG C-ARM 61-120 MIN  COMPARISON:  None.  FINDINGS: Three views from portable C-arm radiography shows open reduction and internal fixation of the proximal left femur fracture. There is an intra medullary rod and screw device. The hardware components and fracture fragments are in anatomic alignment.  IMPRESSION: 1. Status post ORIF of proximal left femur fracture.   Electronically Signed   By: Kerby Moors M.D.   On: 04/23/2014 18:23   Dg Femur Min 2 Views Left  04/23/2014   CLINICAL DATA:  Left hip intertrochanteric fracture.  EXAM: DG FEMUR 2+V*L*; DG C-ARM 61-120 MIN  COMPARISON:  None.  FINDINGS: Three views from portable C-arm radiography shows open reduction and internal fixation of the proximal left femur fracture. There is an intra medullary rod and screw device. The hardware components and fracture fragments are in anatomic alignment.  IMPRESSION: 1. Status post ORIF of  proximal left femur fracture.   Electronically Signed   By: Kerby Moors M.D.   On: 04/23/2014 18:23    Scheduled Meds: . acetaminophen  500 mg Oral TID  . aspirin EC  325 mg Oral Daily  . feeding supplement (ENSURE COMPLETE)  237 mL Oral BID BM  . iron polysaccharides  150 mg Oral Daily  . isosorbide mononitrate  60 mg Oral Daily  . lisinopril  10 mg Oral BID  . metoprolol succinate  50 mg Oral Daily  . metoprolol tartrate  25 mg Oral 4 times per day  . oxybutynin  10 mg Oral Daily  . pantoprazole  40 mg Oral Daily  . vitamin B-12  1,000 mcg Oral Daily   Continuous Infusions: . sodium chloride 75 mL/hr at 04/24/14 0215       Time spent: 35 minutes    Briyana Badman A, PA-S  [facility] Pager [phone] If 7PM-7AM, please contact night-coverage at [email or web address], password [facility] 04/24/2014, 11:38 AM  LOS: 3 days       Addendum  Patient seen and examined, chart and data base  reviewed.  I agree with the above assessment and plan.  For full details please see Mrs. Magda Bernheim, PA-S note.  I reviewed and amended the above note as appropriate.   Birdie Hopes, MD [facility] Pager: [phone] 04/24/2014, 11:38 AM

## 2014-04-24 NOTE — Progress Notes (Signed)
Pt was unable to void after the discontinue to foley cath last night at 12 mn, did bladder scan with 203 ml urine retention on call MD Dr. Rogue Bussing ordered in and out cath but unable to insert cath , charge nurse informed and tried the in and out cath still unsuccessful, paged on call MD again.

## 2014-04-25 ENCOUNTER — Inpatient Hospital Stay (HOSPITAL_COMMUNITY): Payer: Medicare PPO

## 2014-04-25 DIAGNOSIS — I499 Cardiac arrhythmia, unspecified: Secondary | ICD-10-CM

## 2014-04-25 DIAGNOSIS — R0602 Shortness of breath: Secondary | ICD-10-CM

## 2014-04-25 LAB — CBC
HCT: 26.9 % — ABNORMAL LOW (ref 39.0–52.0)
HEMOGLOBIN: 8.8 g/dL — AB (ref 13.0–17.0)
MCH: 28 pg (ref 26.0–34.0)
MCHC: 32.7 g/dL (ref 30.0–36.0)
MCV: 85.7 fL (ref 78.0–100.0)
PLATELETS: 76 10*3/uL — AB (ref 150–400)
RBC: 3.14 MIL/uL — ABNORMAL LOW (ref 4.22–5.81)
RDW: 18.7 % — ABNORMAL HIGH (ref 11.5–15.5)
WBC: 6.3 10*3/uL (ref 4.0–10.5)

## 2014-04-25 LAB — BASIC METABOLIC PANEL
ANION GAP: 6 (ref 5–15)
BUN: 24 mg/dL — AB (ref 6–23)
CHLORIDE: 107 mmol/L (ref 96–112)
CO2: 24 mmol/L (ref 19–32)
CREATININE: 1.07 mg/dL (ref 0.50–1.35)
Calcium: 8.1 mg/dL — ABNORMAL LOW (ref 8.4–10.5)
GFR calc Af Amer: 65 mL/min — ABNORMAL LOW (ref >90)
GFR calc non Af Amer: 56 mL/min — ABNORMAL LOW (ref >90)
Glucose, Bld: 118 mg/dL — ABNORMAL HIGH (ref 70–99)
POTASSIUM: 4.4 mmol/L (ref 3.5–5.1)
Sodium: 137 mmol/L (ref 135–145)

## 2014-04-25 LAB — URINE MICROSCOPIC-ADD ON

## 2014-04-25 LAB — URINALYSIS, ROUTINE W REFLEX MICROSCOPIC
Glucose, UA: NEGATIVE mg/dL
Ketones, ur: 15 mg/dL — AB
Nitrite: NEGATIVE
PH: 5 (ref 5.0–8.0)
PROTEIN: 30 mg/dL — AB
Specific Gravity, Urine: 1.028 (ref 1.005–1.030)
Urobilinogen, UA: 0.2 mg/dL (ref 0.0–1.0)

## 2014-04-25 NOTE — Progress Notes (Signed)
    SUBJECTIVE:   Denies pain but more non verbal today.      PHYSICAL EXAM Filed Vitals:   04/25/14 0000 04/25/14 0102 04/25/14 0400 04/25/14 0504  BP:  117/56  116/50  Pulse:  69  60  Temp:  98.7 F (37.1 C)  97.8 F (36.6 C)  TempSrc:  Axillary  Axillary  Resp: 18 18 18 18   Height:      Weight:      SpO2: 95% 95% 92% 92%   General:  No distress but very frail looking  Lungs:  Clear with decreased bowel sounds.  Heart:  Irregular Abdomen:  Positive bowel sounds, no rebound no guarding Extremities:  No edema  LABS:  Results for orders placed or performed during the hospital encounter of 04/21/14 (from the past 24 hour(s))  CBC     Status: Abnormal   Collection Time: 04/25/14  6:39 AM  Result Value Ref Range   WBC 6.3 4.0 - 10.5 K/uL   RBC 3.14 (L) 4.22 - 5.81 MIL/uL   Hemoglobin 8.8 (L) 13.0 - 17.0 g/dL   HCT 26.9 (L) 39.0 - 52.0 %   MCV 85.7 78.0 - 100.0 fL   MCH 28.0 26.0 - 34.0 pg   MCHC 32.7 30.0 - 36.0 g/dL   RDW 18.7 (H) 11.5 - 15.5 %   Platelets 76 (L) 150 - 400 K/uL  Basic metabolic panel     Status: Abnormal   Collection Time: 04/25/14  6:39 AM  Result Value Ref Range   Sodium 137 135 - 145 mmol/L   Potassium 4.4 3.5 - 5.1 mmol/L   Chloride 107 96 - 112 mmol/L   CO2 24 19 - 32 mmol/L   Glucose, Bld 118 (H) 70 - 99 mg/dL   BUN 24 (H) 6 - 23 mg/dL   Creatinine, Ser 1.07 0.50 - 1.35 mg/dL   Calcium 8.1 (L) 8.4 - 10.5 mg/dL   GFR calc non Af Amer 56 (L) >90 mL/min   GFR calc Af Amer 65 (L) >90 mL/min   Anion gap 6 5 - 15    Intake/Output Summary (Last 24 hours) at 04/25/14 0943 Last data filed at 04/25/14 0505  Gross per 24 hour  Intake    760 ml  Output    725 ml  Net     35 ml     ASSESSMENT AND PLAN:  POST OP:   No apparent acute cardiac complications.   Please call with further questions  ELEVATED TROPONIN:    Trend was down and he is not having any acute cardiac complaints.  No further work up.    ARRHYTHMIA:  Lots of atrial ectopy and  disorganized atrial contractions with perhaps short runs of fib.  Not an anticoagulation candidate.  OK to stop telemetry.  No further work up.    Jeneen Rinks [facility] 04/25/2014 9:43 AM

## 2014-04-25 NOTE — Progress Notes (Signed)
[facility] PROGRESS NOTE   Jordan Little YNW:[id number] DOB: January 26, 1916 DOA: 04/21/2014 PCP: [name], MD  HPI: Jordan Little is a 81 y.o. male with history of CAD, hyperlipidemia, hypertension, and dementia who suffered a mechanical fall 3-5 hours ago in his bathroom at his NH. Landed on L hip. Now has L hip pain, inability to bear weight. Pain is worse with movement, better a rest. Pain is severe. X ray confirms fracture.  Subjective: Sleepy when I saw him this morning, RN reported that he did not sleep very well, received pain medication around 5 AM. Easy to arouse but goes back to sleep right away. Check urinalysis.  Assessment/Plan: Principal Problem:   Closed left hip fracture Active Problems:   Essential hypertension   Elevated troponin   Dementia without behavioral disturbance   Fall   Closed Left Hip Fracture -Unattended fall in nursing home bathroom 04/21/2014, unclear etiology -Considering cardiac etiology d/t history of CAD and elevated troponins -CXR and CT head w/o contrast showed no acute findings -Left hip xray showed intertrochanteric comminuted left proximal femur fracture -Left elbow xray was negative for acute findings. -Toprol-XL added for atrial ectopy and tachycardia. -Status post open reduction internal fixation of the left intertrochanteric hip fracture with IM nail  Arrhythmia Per cardiology atrial ectopy, disorganized atrial contraction and perhaps short runs of atrial fibrillation. Patient is not a candidate for anticoagulation, step telemetry, no further workup.  Elevated Troponins -Pt has history of coronary artery disease -Serial troponins: 0.49, 0.48, 0.32 -Elevated troponin is likely secondary to tachycardia, no chest pain. Metoprolol and aspirin added. -Toprol-XL added, per cardiology continue telemetry for one more day.  Acute Urinary Retention -Pt reports needing to void and feeling lower abdominal  discomfort -possibly related to prostate history -Foley catheter placed. I added Flomax and discontinued oxybutynin.  Dementia -Patient is awake alert oriented has trouble remembering events.  Code Status: DNR Family Communication: Plan discussed with the patient. Disposition Plan: Remains inpatient Diet: Diet regular  Consultants:  Cardiology  Orthopedics  Procedures:  Open reduction of internal fixation of the left intertrochanteric hip fracture with an intramedullary nail 11 x 42, 420 mm with 100 mm lag screw and a distal interlock through the dynamic hole, done by Dr. Berenice Primas.  Antibiotics:  None   Objective: Filed Vitals:   04/25/14 1015  BP: 141/68  Pulse: 80  Temp: 97.7 F (36.5 C)  Resp: 16    Intake/Output Summary (Last 24 hours) at 04/25/14 1301 Last data filed at 04/25/14 1020  Gross per 24 hour  Intake    640 ml  Output    725 ml  Net    -85 ml   Filed Weights   04/22/14 0300  Weight: 66.452 kg (146 lb 8 oz)    Exam: General: Alert and awake, oriented x 1, not in any acute distress. HEENT: anicteric sclera, pupils reactive to light, EOMI, MMM, no lymphadenopathy or masses CVS: irregular heart rate; S1-S2 clear, no murmur rubs or gallops; pedal pulses 3+ bilaterally Chest: clear to auscultation bilaterally, no wheezing, rales or rhonchi Abdomen: nondistended, normal bowel sounds, mild tenderness at lower abdomen, bladder palpable, otherwise soft; bowel sounds present Extremities: no cyanosis, clubbing or edema noted bilaterally; 5/5 strength bilateraly in upper and lower extremities.  Data Reviewed: Basic Metabolic Panel:  Recent Labs Lab 04/21/14 2333 04/23/14 0601 04/24/14 0350 04/25/14 0639  NA 137 137 138 137  K 3.8 4.7 4.1 4.4  CL 106 107 109 107  CO2 23 27 22 24   GLUCOSE 167* 125* 139* 118*  BUN 20 13 12  24*  CREATININE 1.26 0.96 0.97 1.07  CALCIUM 9.0 8.4 8.0* 8.1*   Liver Function Tests:  Recent Labs Lab 04/21/14 2333  04/23/14 0601  AST 26 26  ALT 13 10  ALKPHOS 49 37*  BILITOT 0.7 1.2  PROT 6.3 5.1*  ALBUMIN 3.6 2.9*   No results for input(s): LIPASE, AMYLASE in the last 168 hours. No results for input(s): AMMONIA in the last 168 hours. CBC:  Recent Labs Lab 04/21/14 2333 04/23/14 0601 04/24/14 0350 04/25/14 0639  WBC 9.4 6.4 8.1 6.3  NEUTROABS 7.4 4.7  --   --   HGB 10.1* 7.8* 9.3* 8.8*  HCT 31.6* 24.5* 28.3* 26.9*  MCV 84.3 85.1 85.0 85.7  PLT 113* 111* 72* 76*   Cardiac Enzymes:  Recent Labs Lab 04/21/14 2333 04/22/14 0137 04/22/14 0710 04/22/14 1300  TROPONINI 0.49* 0.48* 0.32* 0.33*   BNP (last 3 results) No results for input(s): PROBNP in the last 8760 hours. CBG: No results for input(s): GLUCAP in the last 168 hours.  Micro Recent Results (from the past 240 hour(s))  Surgical pcr screen     Status: None   Collection Time: 04/22/14  4:25 AM  Result Value Ref Range Status   MRSA, PCR NEGATIVE NEGATIVE Final   Staphylococcus aureus NEGATIVE NEGATIVE Final    Comment:        The Xpert SA Assay (FDA approved for NASAL specimens in patients over 90 years of age), is one component of a comprehensive surveillance program.  Test performance has been validated by [facility] for patients greater than or equal to 76 year old. It is not intended to diagnose infection nor to guide or monitor treatment.      Studies: Dg Chest Port 1v Same Day  04/25/2014   CLINICAL DATA:  Shortness of breath  EXAM: PORTABLE CHEST - 1 VIEW SAME DAY  COMPARISON:  04/21/2014  FINDINGS: Cardiac shadow is within normal limits. Mild bibasilar atelectatic changes are noted new from the prior exam. No acute bony abnormality is seen.  IMPRESSION: New mild bibasilar atelectasis.   Electronically Signed   By: Inez Catalina M.D.   On: 04/25/2014 10:21   Dg C-arm 1-60 Min  04/23/2014   CLINICAL DATA:  Left hip intertrochanteric fracture.  EXAM: DG FEMUR 2+V*L*; DG C-ARM 61-120 MIN  COMPARISON:   None.  FINDINGS: Three views from portable C-arm radiography shows open reduction and internal fixation of the proximal left femur fracture. There is an intra medullary rod and screw device. The hardware components and fracture fragments are in anatomic alignment.  IMPRESSION: 1. Status post ORIF of proximal left femur fracture.   Electronically Signed   By: Kerby Moors M.D.   On: 04/23/2014 18:23   Dg Femur Min 2 Views Left  04/23/2014   CLINICAL DATA:  Left hip intertrochanteric fracture.  EXAM: DG FEMUR 2+V*L*; DG C-ARM 61-120 MIN  COMPARISON:  None.  FINDINGS: Three views from portable C-arm radiography shows open reduction and internal fixation of the proximal left femur fracture. There is an intra medullary rod and screw device. The hardware components and fracture fragments are in anatomic alignment.  IMPRESSION: 1. Status post ORIF of proximal left femur fracture.   Electronically Signed   By: Kerby Moors M.D.   On: 04/23/2014 18:23    Scheduled Meds: . acetaminophen  500 mg Oral TID  . aspirin EC  325 mg Oral Daily  . feeding supplement (ENSURE COMPLETE)  237 mL Oral BID BM  . iron polysaccharides  150 mg Oral Daily  . isosorbide mononitrate  60 mg Oral Daily  . lisinopril  10 mg Oral BID  . metoprolol succinate  50 mg Oral Daily  . pantoprazole  40 mg Oral Daily  . tamsulosin  0.4 mg Oral QPC supper  . vitamin B-12  1,000 mcg Oral Daily   Continuous Infusions:       Time spent: 35 minutes    Frankie Zito A, PA-S  [facility] Pager [phone] If 7PM-7AM, please contact night-coverage at [email or web address], password [facility] 04/25/2014, 1:01 PM  LOS: 4 days       Addendum  Patient seen and examined, chart and data base reviewed.  I agree with the above assessment and plan.  For full details please see Mrs. Magda Bernheim, PA-S note.  I reviewed and amended the above note as appropriate.   Birdie Hopes, MD [facility] Pager:  [phone] 04/25/2014, 1:01 PM

## 2014-04-25 NOTE — Progress Notes (Signed)
   PATIENT ID: Genevive Bi   2 Days Post-Op Procedure(s) (LRB): LEFT INTRAMEDULLARY (IM) NAIL INTERTROCHANTRIC  (Left)  Subjective: Sleeping this am. Able to be aroused and denies left hip pain.   Objective:  Filed Vitals:   04/25/14 0504  BP: 116/50  Pulse: 60  Temp: 97.8 F (36.6 C)  Resp: 18      L hip dressing c/d/i Wiggles toes, distally NVI  Labs:   Recent Labs  04/23/14 0601 04/24/14 0350 04/25/14 0639  HGB 7.8* 9.3* 8.8*   Recent Labs  04/24/14 0350 04/25/14 0639  WBC 8.1 6.3  RBC 3.33* 3.14*  HCT 28.3* 26.9*  PLT 72* 76*   Recent Labs  04/24/14 0350 04/25/14 0639  NA 138 137  K 4.1 4.4  CL 109 107  CO2 22 24  BUN 12 24*  CREATININE 0.97 1.07  GLUCOSE 139* 118*  CALCIUM 8.0* 8.1*    Assessment and Plan: 2 day s/p left IM nail  Touchdown weight bearing Up with PT, rec SNF and OT (consulted) ASA 325mg  daily for DVT prophylaxis, SCDs Minimize pain rx as much as possible D/c per primary team norco script signed in chart for d/c pain rx

## 2014-04-26 ENCOUNTER — Encounter (HOSPITAL_COMMUNITY): Payer: Self-pay | Admitting: Orthopedic Surgery

## 2014-04-26 DIAGNOSIS — W19XXXA Unspecified fall, initial encounter: Secondary | ICD-10-CM

## 2014-04-26 LAB — CBC
HEMATOCRIT: 25.4 % — AB (ref 39.0–52.0)
Hemoglobin: 8.4 g/dL — ABNORMAL LOW (ref 13.0–17.0)
MCH: 28.4 pg (ref 26.0–34.0)
MCHC: 33.1 g/dL (ref 30.0–36.0)
MCV: 85.8 fL (ref 78.0–100.0)
Platelets: 94 10*3/uL — ABNORMAL LOW (ref 150–400)
RBC: 2.96 MIL/uL — ABNORMAL LOW (ref 4.22–5.81)
RDW: 18.6 % — AB (ref 11.5–15.5)
WBC: 4.9 10*3/uL (ref 4.0–10.5)

## 2014-04-26 LAB — BASIC METABOLIC PANEL
Anion gap: 7 (ref 5–15)
BUN: 27 mg/dL — ABNORMAL HIGH (ref 6–23)
CO2: 25 mmol/L (ref 19–32)
CREATININE: 0.99 mg/dL (ref 0.50–1.35)
Calcium: 8.2 mg/dL — ABNORMAL LOW (ref 8.4–10.5)
Chloride: 107 mmol/L (ref 96–112)
GFR calc non Af Amer: 67 mL/min — ABNORMAL LOW (ref >90)
GFR, EST AFRICAN AMERICAN: 78 mL/min — AB (ref >90)
Glucose, Bld: 122 mg/dL — ABNORMAL HIGH (ref 70–99)
POTASSIUM: 4.2 mmol/L (ref 3.5–5.1)
Sodium: 139 mmol/L (ref 135–145)

## 2014-04-26 MED ORDER — TAMSULOSIN HCL 0.4 MG PO CAPS: 0.4000 mg | ORAL_CAPSULE | Freq: Every day | ORAL | Status: AC

## 2014-04-26 MED ORDER — ENOXAPARIN SODIUM 40 MG/0.4ML ~~LOC~~ SOLN: 40.0000 mg | SUBCUTANEOUS | Status: AC

## 2014-04-26 NOTE — Clinical Social Work Note (Signed)
Patient to be d/c'ed today to [facility].  Patient and family agreeable to plans will transport via ems RN to call report.  Patient's son Tobi Bastos was informed about patient's discharge.  Evette Cristal, MSW, [place]

## 2014-04-26 NOTE — Care Management Note (Signed)
  Page 1 of 1   04/26/2014     2:41:30 PM CARE MANAGEMENT NOTE 04/26/2014  Patient:  Jordan Little, Jordan Little   Account Number:  [id number]  Date Initiated:  04/26/2014  Documentation initiated by:  Magdalen Spatz  Subjective/Objective Assessment:     Action/Plan:   Anticipated DC Date:  04/26/2014   Anticipated DC Plan:  SKILLED NURSING FACILITY  In-house referral  Clinical Social Worker         Choice offered to / List presented to:             Status of service:  Completed, signed off Medicare Important Message given?  YES (If response is "NO", the following Medicare IM given date fields will be blank) Date Medicare IM given:  04/26/2014 Medicare IM given by:  Magdalen Spatz Date Additional Medicare IM given:   Additional Medicare IM given by:    Discharge Disposition:    Per UR Regulation:  Reviewed for med. necessity/level of care/duration of stay  If discussed at [place] of Stay Meetings, dates discussed:    Comments:

## 2014-04-26 NOTE — Progress Notes (Signed)
OT Cancellation Note  Patient Details Name: Jordan Little MRN: [id number] DOB: April 05, 1915   Cancelled Treatment:     Reason Eval not completed/pt not seen: Orders received, chart reviewed. Pt with current orders in chart for d/c back to SNF today. Pt is NH whom fell and sustained L hip fracture, he is now TDWB LLE and will require A due to h/o of dementia and PLOF. Will sign off Acute OT at this time secondary to pt w/ d/c orders in chart and returning to SNF.  Josephine Igo Dixon 04/26/2014, 11:46 AM

## 2014-04-26 NOTE — Discharge Summary (Signed)
Physician Discharge Summary  Jordan Little:[id number] DOB: [birth year] DOA: 04/21/2014  PCP: [name], MD  Admit date: 04/21/2014 Discharge date: 04/26/2014  Time spent: 40 minutes  Recommendations for Outpatient Follow-up:  1. Follow-up with Dr. Berenice Primas in 2 weeks. 2. Continue Lovenox for 3 more weeks for postoperative DVT prophylaxis. 3. Keep Foley catheter, discontinued 3-4 days and start voiding trials, if patient still has urine retention re-place the Foley and refer to urology as outpatient.  Discharge Diagnoses:  Principal Problem:   Closed left hip fracture Active Problems:   Essential hypertension   Elevated troponin   Dementia without behavioral disturbance   Fall   SOB (shortness of breath)   Discharge Condition: Stable  Diet recommendation: Healthy diet  Filed Weights   04/22/14 0300  Weight: 66.452 kg (146 lb 8 oz)    History of present illness:  Jordan Little is a 81 y.o. male who suffered a mechanical fall 3-5 hours ago in his bathroom at his NH. Landed on L hip. Now has L hip pain, inability to bear weight. Pain is worse with movement, better a rest. Pain is severe. X ray confirms fracture.  Hospital Course:   Closed Left Hip Fracture -Unattended fall in nursing home bathroom 04/21/2014, unclear etiology -Considering cardiac etiology d/t history of CAD and elevated troponins -CXR and CT head w/o contrast showed no acute findings -Left hip xray showed intertrochanteric comminuted left proximal femur fracture -Left elbow xray was negative for acute findings. -Toprol-XL added for atrial ectopy and tachycardia. -Status post open reduction internal fixation of the left intertrochanteric hip fracture with IM nail  Arrhythmia Per cardiology atrial ectopy, disorganized atrial contraction and perhaps short runs of atrial fibrillation. Patient is not a candidate for anticoagulation, step telemetry, no further workup.  Elevated Troponins -Pt has  history of coronary artery disease -Serial troponins: 0.49, 0.48, 0.32 -Elevated troponin is likely secondary to tachycardia, no chest pain. Metoprolol and aspirin added. -Toprol-XL added, per cardiology continue telemetry for one more day.  Acute Urinary Retention -Pt reports needing to void and feeling lower abdominal discomfort -possibly related to prostate history -Foley catheter placed. I added Flomax and discontinued oxybutynin. -Discharged with Foley catheter, try to remove in 3-4 days and start voiding trials, if still has problems referred to urologist as outpatient.  Dementia -Patient is awake alert oriented has trouble remembering events.   Procedures:  Open reduction of internal fixation of the left intertrochanteric hip fracture with an intramedullary nail 11 x 42, 420 mm with 100 mm lag screw and a distal interlock through the dynamic hole, done by Dr. Berenice Primas on 04/25/2014.  Consultations:  Orthopedics  Discharge Exam: Filed Vitals:   04/26/14 0800  BP:   Pulse:   Temp:   Resp: 18   General: Alert and awake, oriented x3, not in any acute distress. HEENT: anicteric sclera, pupils reactive to light and accommodation, EOMI CVS: S1-S2 clear, no murmur rubs or gallops Chest: clear to auscultation bilaterally, no wheezing, rales or rhonchi Abdomen: soft nontender, nondistended, normal bowel sounds, no organomegaly Extremities: no cyanosis, clubbing or edema noted bilaterally Neuro: Cranial nerves II-XII intact, no focal neurological deficits.  Discharge Instructions   Discharge Instructions    Diet - low sodium heart healthy    Complete by:  As directed      Increase activity slowly    Complete by:  As directed      Touch down weight bearing    Complete by:  As directed  Laterality:  left  Extremity:  Lower          Current Discharge Medication List    START taking these medications   Details  enoxaparin (LOVENOX) 40 MG/0.4ML injection Inject 0.4 mLs  (40 mg total) into the skin daily. Qty: 21 Syringe, Refills: 0    HYDROcodone-acetaminophen (NORCO) 5-325 MG per tablet Take 1 tablet by mouth every 8 (eight) hours as needed for severe pain. Qty: 40 tablet, Refills: 0    tamsulosin (FLOMAX) 0.4 MG CAPS capsule Take 1 capsule (0.4 mg total) by mouth daily after supper. Qty: 30 capsule      CONTINUE these medications which have NOT CHANGED   Details  acetaminophen (TYLENOL) 500 MG tablet Take 500 mg by mouth 3 (three) times daily.    aspirin 81 MG tablet Take 81 mg by mouth daily.      ergocalciferol (VITAMIN D2) 50000 UNITS capsule Take 50,000 Units by mouth once a week.    glucosamine-chondroitin 500-400 MG tablet Take 2 tablets by mouth daily.     iron polysaccharides (NIFEREX) 150 MG capsule Take 150 mg by mouth daily.    isosorbide mononitrate (IMDUR) 60 MG 24 hr tablet Take 60 mg by mouth daily.      lisinopril (PRINIVIL,ZESTRIL) 10 MG tablet Take 10 mg by mouth 2 (two) times daily.    Menthol, Topical Analgesic, 4 % GEL Apply 1 inch topically daily.    metoprolol succinate (TOPROL-XL) 50 MG 24 hr tablet Take 50 mg by mouth daily. Take with or immediately following a meal.    oxybutynin (DITROPAN-XL) 10 MG 24 hr tablet TAKE ONE TABLET BY MOUTH ONE TIME DAILY Qty: 30 tablet, Refills: 0    pantoprazole (PROTONIX) 40 MG tablet Take 40 mg by mouth daily.    polyethylene glycol (MIRALAX / GLYCOLAX) packet Take 17 g by mouth daily as needed for mild constipation.    vitamin B-12 (CYANOCOBALAMIN) 1000 MCG tablet Take 1,000 mcg by mouth daily.      STOP taking these medications     traMADol (ULTRAM) 50 MG tablet        Allergies  Allergen Reactions  . Iodine     Throat swelling  . Dilaudid [Hydromorphone Hcl]     confusion  . Fentanyl     Confusion    Follow-up Information    Follow up with GRAVES,JOHN L, MD. Schedule an appointment as soon as possible for a visit in 2 weeks.   Specialty:  Orthopedic Surgery    Contact information:   [place] [address] [phone]        The results of significant diagnostics from this hospitalization (including imaging, microbiology, ancillary and laboratory) are listed below for reference.    Significant Diagnostic Studies: Dg Chest 1 View  04/22/2014   CLINICAL DATA:  Fall with left hip pain.  EXAM: CHEST - 1 VIEW  COMPARISON:  05/19/2009  FINDINGS: Cardiac silhouette top-normal in size. Aorta is uncoiled. No mediastinal or hilar masses. Lungs are clear. No pleural effusion or pneumothorax.  Bony thorax is diffusely demineralized but grossly intact.  IMPRESSION: No acute cardiopulmonary disease.   Electronically Signed   By: Lajean Manes M.D.   On: 04/22/2014 00:25   Dg Elbow 2 Views Left  04/22/2014   CLINICAL DATA:  Fall with left elbow pain.  Initial encounter.  EXAM: LEFT ELBOW - 2 VIEW  COMPARISON:  None.  FINDINGS: No evidence of joint effusion, fracture, or dislocation. No acute  soft tissue findings.  IMPRESSION: Negative.   Electronically Signed   By: Jorje Guild M.D.   On: 04/22/2014 02:52   Ct Head Wo Contrast  04/22/2014   CLINICAL DATA:  Per EMS, the patient is from [facility] in [place], and fell tonight trying to use the restroom. He typically uses a walker, the son reports. Fall was around 7:50 tonight, with confirmed x-ray of left hip at the facility at 2030, not present with paperwork on arrival. 100 mcg of fentanyl given by ems, for pain management, side effects of confusion and a-fib with abberancy on ekg.  EXAM: CT HEAD WITHOUT CONTRAST  TECHNIQUE: Contiguous axial images were obtained from the base of the skull through the vertex without intravenous contrast.  COMPARISON:  12/13/2013  FINDINGS: Ventricles normal configuration. There is ventricular and sulcal enlargement reflecting a moderate atrophy. No hydrocephalus.  No parenchymal masses or mass effect. There is no evidence of a a cortical infarct.  Patchy areas of white  matter hypoattenuation are noted, stable, consistent with moderate chronic microvascular ischemic change.  No extra-axial masses or abnormal fluid collections.  There is no intracranial hemorrhage.  Fluid level in the right sphenoid sinus, stable from the prior exam. Remaining sinuses are clear as are the mastoid air cells. No skull fracture.  IMPRESSION: 1. No acute intracranial abnormalities. 2. Atrophy and chronic microvascular ischemic change, stable.   Electronically Signed   By: Lajean Manes M.D.   On: 04/22/2014 00:09   Dg Chest Port 1v Same Day  04/25/2014   CLINICAL DATA:  Shortness of breath  EXAM: PORTABLE CHEST - 1 VIEW SAME DAY  COMPARISON:  04/21/2014  FINDINGS: Cardiac shadow is within normal limits. Mild bibasilar atelectatic changes are noted new from the prior exam. No acute bony abnormality is seen.  IMPRESSION: New mild bibasilar atelectasis.   Electronically Signed   By: Inez Catalina M.D.   On: 04/25/2014 10:21   Dg C-arm 1-60 Min  04/23/2014   CLINICAL DATA:  Left hip intertrochanteric fracture.  EXAM: DG FEMUR 2+V*L*; DG C-ARM 61-120 MIN  COMPARISON:  None.  FINDINGS: Three views from portable C-arm radiography shows open reduction and internal fixation of the proximal left femur fracture. There is an intra medullary rod and screw device. The hardware components and fracture fragments are in anatomic alignment.  IMPRESSION: 1. Status post ORIF of proximal left femur fracture.   Electronically Signed   By: Kerby Moors M.D.   On: 04/23/2014 18:23   Dg Hip Unilat With Pelvis 2-3 Views Left  04/21/2014   CLINICAL DATA:  Fall tonight.  Left hip pain.  EXAM: DG HIP W/ PELVIS 2-3V*L*  COMPARISON:  None.  FINDINGS: There is an intertrochanteric fracture of the proximal left femur. There are greater and lesser trochanteric fracture fragments. The primary fracture components are mildly displaced, 2.4 cm, with the inferior neck fracture component overlapped on the metaphyseal component.  There is varus angulation.  No other fractures. No dislocation. Bones are extensively demineralized. There are dense calcifications along the femoral vessels.  IMPRESSION: Mildly displaced intertrochanteric comminuted left proximal femur fracture with varus angulation.   Electronically Signed   By: Lajean Manes M.D.   On: 04/21/2014 23:59   Dg Femur Min 2 Views Left  04/23/2014   CLINICAL DATA:  Left hip intertrochanteric fracture.  EXAM: DG FEMUR 2+V*L*; DG C-ARM 61-120 MIN  COMPARISON:  None.  FINDINGS: Three views from portable C-arm radiography shows open reduction and internal fixation  of the proximal left femur fracture. There is an intra medullary rod and screw device. The hardware components and fracture fragments are in anatomic alignment.  IMPRESSION: 1. Status post ORIF of proximal left femur fracture.   Electronically Signed   By: Kerby Moors M.D.   On: 04/23/2014 18:23    Microbiology: Recent Results (from the past 240 hour(s))  Surgical pcr screen     Status: None   Collection Time: 04/22/14  4:25 AM  Result Value Ref Range Status   MRSA, PCR NEGATIVE NEGATIVE Final   Staphylococcus aureus NEGATIVE NEGATIVE Final    Comment:        The Xpert SA Assay (FDA approved for NASAL specimens in patients over 52 years of age), is one component of a comprehensive surveillance program.  Test performance has been validated by [facility] for patients greater than or equal to 108 year old. It is not intended to diagnose infection nor to guide or monitor treatment.      Labs: Basic Metabolic Panel:  Recent Labs Lab 04/21/14 2333 04/23/14 0601 04/24/14 0350 04/25/14 0639 04/26/14 0610  NA 137 137 138 137 139  K 3.8 4.7 4.1 4.4 4.2  CL 106 107 109 107 107  CO2 23 27 22 24 25   GLUCOSE 167* 125* 139* 118* 122*  BUN 20 13 12  24* 27*  CREATININE 1.26 0.96 0.97 1.07 0.99  CALCIUM 9.0 8.4 8.0* 8.1* 8.2*   Liver Function Tests:  Recent Labs Lab 04/21/14 2333  04/23/14 0601  AST 26 26  ALT 13 10  ALKPHOS 49 37*  BILITOT 0.7 1.2  PROT 6.3 5.1*  ALBUMIN 3.6 2.9*   No results for input(s): LIPASE, AMYLASE in the last 168 hours. No results for input(s): AMMONIA in the last 168 hours. CBC:  Recent Labs Lab 04/21/14 2333 04/23/14 0601 04/24/14 0350 04/25/14 0639 04/26/14 0610  WBC 9.4 6.4 8.1 6.3 4.9  NEUTROABS 7.4 4.7  --   --   --   HGB 10.1* 7.8* 9.3* 8.8* 8.4*  HCT 31.6* 24.5* 28.3* 26.9* 25.4*  MCV 84.3 85.1 85.0 85.7 85.8  PLT 113* 111* 72* 76* 94*   Cardiac Enzymes:  Recent Labs Lab 04/21/14 2333 04/22/14 0137 04/22/14 0710 04/22/14 1300  TROPONINI 0.49* 0.48* 0.32* 0.33*   BNP: BNP (last 3 results) No results for input(s): PROBNP in the last 8760 hours. CBG: No results for input(s): GLUCAP in the last 168 hours.     Signed:  Rodina Pinales A  [facility] 04/26/2014, 10:27 AM

## 2014-04-26 NOTE — Progress Notes (Signed)
Subjective: 3 Days Post-Op Procedure(s) (LRB): LEFT INTRAMEDULLARY (IM) NAIL INTERTROCHANTRIC  (Left) Patient reports pain as patient not able to give good history as relates to pain..    Objective: Vital signs in last 24 hours: Temp:  [97.7 F (36.5 C)-98.9 F (37.2 C)] 98.9 F (37.2 C) (01/25 0536) Pulse Rate:  [80-90] 84 (01/25 0536) Resp:  [16-18] 16 (01/25 0536) BP: (112-156)/(56-69) 156/69 mmHg (01/25 0536) SpO2:  [94 %-99 %] 94 % (01/25 0536)  Intake/Output from previous day: 01/24 0701 - 01/25 0700 In: 240 [P.O.:240] Out: 550 [Urine:550] Intake/Output this shift:     Recent Labs  04/24/14 0350 04/25/14 0639 04/26/14 0610  HGB 9.3* 8.8* 8.4*    Recent Labs  04/25/14 0639 04/26/14 0610  WBC 6.3 4.9  RBC 3.14* 2.96*  HCT 26.9* 25.4*  PLT 76* 94*    Recent Labs  04/25/14 0639 04/26/14 0610  NA 137 139  K 4.4 4.2  CL 107 107  CO2 24 25  BUN 24* 27*  CREATININE 1.07 0.99  GLUCOSE 118* 122*  CALCIUM 8.1* 8.2*   No results for input(s): LABPT, INR in the last 72 hours.  Neurologically intact Dorsiflexion/Plantar flexion intact No cellulitis present Compartment soft  Assessment/Plan: 3 Days Post-Op Procedure(s) (LRB): LEFT INTRAMEDULLARY (IM) NAIL INTERTROCHANTRIC  (Left) Advance diet Up with therapy Discharge to SNF as I don't believe patient will be able to be discharged home.  He needs to maintain touchdown weightbearing only left side.  Giordano Getman L 04/26/2014, 8:33 AM

## 2014-04-26 NOTE — Progress Notes (Signed)
PTAR present to transfer patient to [facility] SN[facility]. All documents sent with patient. Report called and given to nurse Tonya. Patient ready for discharge and in route to nursing facility.

## 2014-04-26 NOTE — Progress Notes (Signed)
Physical Therapy Treatment Patient Details Name: Jordan Little MRN: [id number] DOB: 1915-07-10 Today's Date: 04/26/2014    History of Present Illness Patient is a 81 y/o male s/p IM nailing left hip after fall resulting in left prox femur fx. PMH of prostate ca, CAD, HTN and PNA.     PT Comments    Patient progressing slowly with mobility. Tolerated dynamic sitting balance performing there ex EOB with less support today. Able to stand with mod A of 2 however non compliant with WB status. Tolerated transfer to chair with encouragement and assist of 2. Pt more able to mobilize LLE today during mobility and transfers with assist. Will continue to follow acutely. Instructed tech to use lift equipment to transfer pt back to bed.    Follow Up Recommendations  SNF;Supervision/Assistance - 24 hour     Equipment Recommendations  None recommended by PT    Recommendations for Other Services       Precautions / Restrictions Precautions Precautions: Fall Restrictions Weight Bearing Restrictions: Yes LLE Weight Bearing: Touchdown weight bearing    Mobility  Bed Mobility Overal bed mobility: Needs Assistance Bed Mobility: Supine to Sit     Supine to sit: Max assist;+2 for physical assistance;HOB elevated     General bed mobility comments: Max A to bring BLEs, bottom and elevate trunk to EOB. Able to assist mobilizing LLE to EOB today.  Transfers Overall transfer level: Needs assistance Equipment used:  (handles on back of chair.) Transfers: Squat Pivot Transfers;Sit to/from Stand Sit to Stand: Mod assist;+2 physical assistance;From elevated surface   Squat pivot transfers: Max assist;+2 physical assistance     General transfer comment: Mod A of 2 to stand with pt pulling up on chair handles. Non compliant with TDWB LLE. Manual cues for hip extension and upright posture. Squat pivot transfer bed to chair Max A of 2 using chuck pad.   Ambulation/Gait                  Stairs            Wheelchair Mobility    Modified Rankin (Stroke Patients Only)       Balance Overall balance assessment: Needs assistance Sitting-balance support: Feet supported;Bilateral upper extremity supported;No upper extremity supported Sitting balance-Leahy Scale: Fair Sitting balance - Comments: Tolerated static sitting and dynamic sitting with BUE progressing to no UE support for small periods. Min A-Min guard assist for sitting balance. Sat EOB ~16 minutes. Postural control: Right lateral lean;Posterior lean Standing balance support: During functional activity Standing balance-Leahy Scale: Zero Standing balance comment: requires Max A of 2 to stand from EOB.                     Cognition Arousal/Alertness: Awake/alert   Overall Cognitive Status: History of cognitive impairments - at baseline       Memory: Decreased recall of precautions              Exercises General Exercises - Lower Extremity Ankle Circles/Pumps: Both;15 reps;Supine Heel Slides: AAROM;Left;10 reps;Supine Hip ABduction/ADduction: AAROM;Left;Seated;10 reps Straight Leg Raises: AAROM;Left;10 reps;Seated    General Comments        Pertinent Vitals/Pain Pain Assessment: Faces Faces Pain Scale: Hurts whole lot Pain Location: left hip Pain Intervention(s): Limited activity within patient's tolerance;Repositioned;Monitored during session    Home Living                      Prior Function  PT Goals (current goals can now be found in the care plan section) Progress towards PT goals: Progressing toward goals    Frequency  Min 3X/week    PT Plan Current plan remains appropriate    Co-evaluation             End of Session Equipment Utilized During Treatment: Gait belt;Oxygen Activity Tolerance: Patient tolerated treatment well;Patient limited by pain Patient left: in chair;with call bell/phone within reach (Chair alarm pad under patient  however alarm system not working. Tech notified of need for lift equipment to return to bed. pad left in room.)     Time: 7116-5790 PT Time Calculation (min) (ACUTE ONLY): 23 min  Charges:  $Therapeutic Exercise: 8-22 mins $Therapeutic Activity: 8-22 mins                    G CodesCandy Sledge A May 20, 2014, 11:40 AM  Candy Sledge, PT, DPT [phone]

## 2014-04-26 NOTE — Anesthesia Postprocedure Evaluation (Signed)
Anesthesia Post Note  Patient: Jordan Little  Procedure(s) Performed: Procedure(s) (LRB): LEFT INTRAMEDULLARY (IM) NAIL INTERTROCHANTRIC  (Left)  Anesthesia type: General  Patient location: PACU  Post pain: Pain level controlled  Post assessment: Post-op Vital signs reviewed  Last Vitals: BP 114/68 mmHg  Pulse 84  Temp(Src) 36.6 C (Oral)  Resp 18  Ht 5' 5.5" (1.664 m)  Wt 146 lb 8 oz (66.452 kg)  BMI 24.00 kg/m2  SpO2 98%  Post vital signs: Reviewed  Level of consciousness: sedated  Complications: No apparent anesthesia complications

## 2014-04-27 LAB — TYPE AND SCREEN
ABO/RH(D): O POS
Antibody Screen: NEGATIVE
UNIT DIVISION: 0
Unit division: 0
Unit division: 0
Unit division: 0

## 2014-06-24 ENCOUNTER — Emergency Department (HOSPITAL_COMMUNITY): Payer: Medicare PPO

## 2014-06-24 ENCOUNTER — Encounter (HOSPITAL_COMMUNITY): Payer: Self-pay | Admitting: *Deleted

## 2014-06-24 ENCOUNTER — Emergency Department (HOSPITAL_COMMUNITY)
Admission: EM | Admit: 2014-06-24 | Discharge: 2014-06-24 | Disposition: A | Discharge status: Home / Self Care | Payer: Medicare PPO | Attending: Emergency Medicine | Admitting: Emergency Medicine

## 2014-06-24 DIAGNOSIS — Z8701 Personal history of pneumonia (recurrent): Secondary | ICD-10-CM | POA: Insufficient documentation

## 2014-06-24 DIAGNOSIS — Z862 Personal history of diseases of the blood and blood-forming organs and certain disorders involving the immune mechanism: Secondary | ICD-10-CM | POA: Diagnosis not present

## 2014-06-24 DIAGNOSIS — W19XXXA Unspecified fall, initial encounter: Secondary | ICD-10-CM

## 2014-06-24 DIAGNOSIS — Z9861 Coronary angioplasty status: Secondary | ICD-10-CM | POA: Insufficient documentation

## 2014-06-24 DIAGNOSIS — S32592A Other specified fracture of left pubis, initial encounter for closed fracture: Secondary | ICD-10-CM | POA: Diagnosis not present

## 2014-06-24 DIAGNOSIS — Z87891 Personal history of nicotine dependence: Secondary | ICD-10-CM | POA: Insufficient documentation

## 2014-06-24 DIAGNOSIS — F0390 Unspecified dementia without behavioral disturbance: Secondary | ICD-10-CM | POA: Insufficient documentation

## 2014-06-24 DIAGNOSIS — Z7982 Long term (current) use of aspirin: Secondary | ICD-10-CM | POA: Diagnosis not present

## 2014-06-24 DIAGNOSIS — Z8639 Personal history of other endocrine, nutritional and metabolic disease: Secondary | ICD-10-CM | POA: Diagnosis not present

## 2014-06-24 DIAGNOSIS — I2510 Atherosclerotic heart disease of native coronary artery without angina pectoris: Secondary | ICD-10-CM | POA: Diagnosis not present

## 2014-06-24 DIAGNOSIS — Z87442 Personal history of urinary calculi: Secondary | ICD-10-CM | POA: Diagnosis not present

## 2014-06-24 DIAGNOSIS — Y998 Other external cause status: Secondary | ICD-10-CM | POA: Diagnosis not present

## 2014-06-24 DIAGNOSIS — Z79899 Other long term (current) drug therapy: Secondary | ICD-10-CM | POA: Diagnosis not present

## 2014-06-24 DIAGNOSIS — Y9289 Other specified places as the place of occurrence of the external cause: Secondary | ICD-10-CM | POA: Diagnosis not present

## 2014-06-24 DIAGNOSIS — Y939 Activity, unspecified: Secondary | ICD-10-CM | POA: Insufficient documentation

## 2014-06-24 DIAGNOSIS — S79912A Unspecified injury of left hip, initial encounter: Secondary | ICD-10-CM | POA: Diagnosis present

## 2014-06-24 DIAGNOSIS — Z8546 Personal history of malignant neoplasm of prostate: Secondary | ICD-10-CM | POA: Diagnosis not present

## 2014-06-24 DIAGNOSIS — Z8719 Personal history of other diseases of the digestive system: Secondary | ICD-10-CM | POA: Insufficient documentation

## 2014-06-24 DIAGNOSIS — I10 Essential (primary) hypertension: Secondary | ICD-10-CM | POA: Diagnosis not present

## 2014-06-24 HISTORY — DX: Unilateral inguinal hernia, without obstruction or gangrene, not specified as recurrent: K40.90

## 2014-06-24 HISTORY — DX: Unspecified dementia, unspecified severity, without behavioral disturbance, psychotic disturbance, mood disturbance, and anxiety: F03.90

## 2014-06-24 HISTORY — DX: Unspecified atherosclerosis: I70.90

## 2014-06-24 HISTORY — DX: Ataxic gait: R26.0

## 2014-06-24 HISTORY — DX: Anemia, unspecified: D64.9

## 2014-06-24 LAB — CBC WITH DIFFERENTIAL/PLATELET
Basophils Absolute: 0 10*3/uL (ref 0.0–0.1)
Basophils Relative: 0 % (ref 0–1)
EOS PCT: 1 % (ref 0–5)
Eosinophils Absolute: 0 10*3/uL (ref 0.0–0.7)
HCT: 28.2 % — ABNORMAL LOW (ref 39.0–52.0)
HEMOGLOBIN: 8.9 g/dL — AB (ref 13.0–17.0)
LYMPHS ABS: 1.7 10*3/uL (ref 0.7–4.0)
Lymphocytes Relative: 21 % (ref 12–46)
MCH: 28.7 pg (ref 26.0–34.0)
MCHC: 31.6 g/dL (ref 30.0–36.0)
MCV: 91 fL (ref 78.0–100.0)
MONOS PCT: 7 % (ref 3–12)
Monocytes Absolute: 0.6 10*3/uL (ref 0.1–1.0)
Neutro Abs: 5.8 10*3/uL (ref 1.7–7.7)
Neutrophils Relative %: 71 % (ref 43–77)
Platelets: 179 10*3/uL (ref 150–400)
RBC: 3.1 MIL/uL — AB (ref 4.22–5.81)
RDW: 17.1 % — ABNORMAL HIGH (ref 11.5–15.5)
WBC: 8.1 10*3/uL (ref 4.0–10.5)

## 2014-06-24 LAB — URINALYSIS, ROUTINE W REFLEX MICROSCOPIC
Bilirubin Urine: NEGATIVE
Glucose, UA: NEGATIVE mg/dL
HGB URINE DIPSTICK: NEGATIVE
Ketones, ur: NEGATIVE mg/dL
LEUKOCYTES UA: NEGATIVE
NITRITE: NEGATIVE
PH: 5.5 (ref 5.0–8.0)
Protein, ur: NEGATIVE mg/dL
SPECIFIC GRAVITY, URINE: 1.019 (ref 1.005–1.030)
Urobilinogen, UA: 0.2 mg/dL (ref 0.0–1.0)

## 2014-06-24 LAB — BASIC METABOLIC PANEL
Anion gap: 8 (ref 5–15)
BUN: 22 mg/dL (ref 6–23)
CALCIUM: 8.7 mg/dL (ref 8.4–10.5)
CO2: 21 mmol/L (ref 19–32)
Chloride: 107 mmol/L (ref 96–112)
Creatinine, Ser: 0.92 mg/dL (ref 0.50–1.35)
GFR, EST AFRICAN AMERICAN: 80 mL/min — AB (ref >90)
GFR, EST NON AFRICAN AMERICAN: 69 mL/min — AB (ref >90)
Glucose, Bld: 112 mg/dL — ABNORMAL HIGH (ref 70–99)
POTASSIUM: 4.8 mmol/L (ref 3.5–5.1)
Sodium: 136 mmol/L (ref 135–145)

## 2014-06-24 LAB — TYPE AND SCREEN
ABO/RH(D): O POS
Antibody Screen: NEGATIVE

## 2014-06-24 LAB — PROTIME-INR
INR: 1.08 (ref 0.00–1.49)
PROTHROMBIN TIME: 14.1 s (ref 11.6–15.2)

## 2014-06-24 LAB — I-STAT TROPONIN, ED: Troponin i, poc: 0.01 ng/mL (ref 0.00–0.08)

## 2014-06-24 MED ORDER — FENTANYL CITRATE 0.05 MG/ML IJ SOLN
50.0000 ug | Freq: Once | INTRAMUSCULAR | Status: AC
  Administered 2014-06-24: 50 ug via INTRAVENOUS
  Filled 2014-06-24: qty 2

## 2014-06-24 MED ORDER — TRAMADOL HCL 50 MG PO TABS: 50.0000 mg | ORAL_TABLET | Freq: Four times a day (QID) | ORAL | Status: DC | PRN

## 2014-06-24 NOTE — Discharge Instructions (Signed)
Take the prescribed medication as directed for pain. Hold off on physical therapy for the next few weeks until pelvis heals. Follow-up with Dr. Berenice Primas. Return to the ED for new or worsening symptoms.

## 2014-06-24 NOTE — ED Notes (Addendum)
Pt from [facility] [place] [address]) for L hip pain.  Pt was found on floor at 0630, placed back in bed.  However, pt began c/o L hip pain and staff noticed L leg was shorter than R and rotated in.

## 2014-06-24 NOTE — ED Provider Notes (Signed)
CSN: [id number]     Arrival date & time 06/24/14  7824 History   First MD Initiated Contact with Patient 06/24/14 [phone]     Chief Complaint  Patient presents with  . Hip Pain    fall with dimentia     (Consider location/radiation/quality/duration/timing/severity/associated sxs/prior Treatment) Patient is a 81 y.o. male presenting with hip pain. The history is provided by the patient and medical records.  Hip Pain Associated symptoms include arthralgias.    This is a 81 year old male with past medical history significant for hypertension, hyperlipidemia, coronary artery disease, anemia, presenting to the ED from SNF for a unwitnessed fall. Patient was found in the floor at 0630 and placed back into bed.  He later began complaining of left hip pain and was transferred here for further evaluation.  He states he does not remember how he fell but he does state he hit his head. He is unsure of loss of consciousness.  He denies any current headache, dizziness, visual disturbance, numbness, or weakness. He complains of left hip pain. Notably patient sustained a left hip fracture in January 2016 and had this repaired by Dr. Berenice Primas.  He is on daily aspirin as well as Lovenox.  He denies any chest pain or shortness of breath.  Past Medical History  Diagnosis Date  . Prostate cancer     Viadur Implant, Dr. Gaynelle Arabian  . CAD (coronary artery disease)     Dr Gwenlyn Found  . Hyperlipidemia   . Hypertension   . Diverticulosis 2010    with colon bleed  . Personal history of renal calculi 2007  . Pneumonia 2007  . Diverticulitis 01/05/2012    [facility]  . Pernicious anemia    Past Surgical History  Procedure Laterality Date  . Coronary angioplasty with stent placement  1999    Dr. Gwenlyn Found, 4 vessels  . Transurethral resection of prostate      Dr.   Marla Roe  . Prostate surgery      Dr. Gaynelle Arabian  . Laparoscopic appendectomy  07/06/2011    Procedure: APPENDECTOMY LAPAROSCOPIC;  Surgeon:  Adin Hector, MD;  Location: [facility];  Service: General;  Laterality: N/A;  . Intramedullary (im) nail intertrochanteric Left 04/23/2014    Procedure: LEFT INTRAMEDULLARY (IM) NAIL INTERTROCHANTRIC ;  Surgeon: Alta Corning, MD;  Location: [facility];  Service: Orthopedics;  Laterality: Left;   Family History  Problem Relation Age of Onset  . Cancer Mother   . Cancer Father     prostate   History  Substance Use Topics  . Smoking status: Former Research scientist (life sciences)  . Smokeless tobacco: Former Systems developer    Quit date: 07/17/1971  . Alcohol Use: No    Review of Systems  Musculoskeletal: Positive for arthralgias.       Fall, left hip pain  All other systems reviewed and are negative.     Allergies  Iodine; Dilaudid; and Fentanyl  Home Medications   Prior to Admission medications   Medication Sig Start Date End Date Taking? Authorizing Provider  acetaminophen (TYLENOL) 500 MG tablet Take 500 mg by mouth 3 (three) times daily.    Historical Provider, MD  aspirin 81 MG tablet Take 81 mg by mouth daily.      Historical Provider, MD  enoxaparin (LOVENOX) 40 MG/0.4ML injection Inject 0.4 mLs (40 mg total) into the skin daily. 04/26/14   Verlee Monte, MD  ergocalciferol (VITAMIN D2) 50000 UNITS capsule Take 50,000 Units by mouth once a  week.    Historical Provider, MD  glucosamine-chondroitin 500-400 MG tablet Take 2 tablets by mouth daily.     Historical Provider, MD  HYDROcodone-acetaminophen (NORCO) 5-325 MG per tablet Take 1 tablet by mouth every 8 (eight) hours as needed for severe pain. 04/23/14   Gary Fleet, PA-C  iron polysaccharides (NIFEREX) 150 MG capsule Take 150 mg by mouth daily.    Historical Provider, MD  isosorbide mononitrate (IMDUR) 60 MG 24 hr tablet Take 60 mg by mouth daily.      Historical Provider, MD  lisinopril (PRINIVIL,ZESTRIL) 10 MG tablet Take 10 mg by mouth 2 (two) times daily.    Historical Provider, MD  Menthol, Topical Analgesic, 4 % GEL Apply 1 inch topically daily.     Historical Provider, MD  metoprolol succinate (TOPROL-XL) 50 MG 24 hr tablet Take 50 mg by mouth daily. Take with or immediately following a meal.    Historical Provider, MD  oxybutynin (DITROPAN-XL) 10 MG 24 hr tablet TAKE ONE TABLET BY MOUTH ONE TIME DAILY 01/16/12   Hendricks Limes, MD  pantoprazole (PROTONIX) 40 MG tablet Take 40 mg by mouth daily.    Historical Provider, MD  polyethylene glycol (MIRALAX / GLYCOLAX) packet Take 17 g by mouth daily as needed for mild constipation.    Historical Provider, MD  tamsulosin (FLOMAX) 0.4 MG CAPS capsule Take 1 capsule (0.4 mg total) by mouth daily after supper. 04/26/14   Verlee Monte, MD  vitamin B-12 (CYANOCOBALAMIN) 1000 MCG tablet Take 1,000 mcg by mouth daily.    Historical Provider, MD   BP 180/73 mmHg  Pulse 90  Temp(Src) 98 F (36.7 C) (Oral)  Resp 18  SpO2 93%   Physical Exam  Constitutional: He is oriented to person, place, and time. He appears well-developed and well-nourished. No distress.  Elderly, thin  HENT:  Head: Normocephalic and atraumatic.  Mouth/Throat: Oropharynx is clear and moist.  No external signs of head trauma; no hemotympanum; dry mucous membranes, poor dentition  Eyes: Conjunctivae and EOM are normal. Pupils are equal, round, and reactive to light.  Neck:  c-collar in place  Cardiovascular: Normal rate, regular rhythm and normal heart sounds.   Pulmonary/Chest: Effort normal and breath sounds normal.  Abdominal: Soft. Bowel sounds are normal.  Musculoskeletal: Normal range of motion.  Left hip TTP with limited ROM; leg shortened and internally rotated; DP pulse and sensation intact  Neurological: He is alert and oriented to person, place, and time.  Skin: Skin is warm and dry. He is not diaphoretic.  Psychiatric: He has a normal mood and affect.  Nursing note and vitals reviewed.   ED Course  Procedures (including critical care time) Labs Review Labs Reviewed  BASIC METABOLIC PANEL - Abnormal;  Notable for the following:    Glucose, Bld 112 (*)    GFR calc non Af Amer 69 (*)    GFR calc Af Amer 80 (*)    All other components within normal limits  CBC WITH DIFFERENTIAL/PLATELET - Abnormal; Notable for the following:    RBC 3.10 (*)    Hemoglobin 8.9 (*)    HCT 28.2 (*)    RDW 17.1 (*)    All other components within normal limits  PROTIME-INR  URINALYSIS, ROUTINE W REFLEX MICROSCOPIC  I-STAT TROPOININ, ED  TYPE AND SCREEN    Imaging Review Dg Chest 1 View  06/24/2014   CLINICAL DATA:  Fall, left hip pain  EXAM: CHEST  1 VIEW  COMPARISON:  04/25/2014  FINDINGS: Cardiomediastinal silhouette is stable. No acute infiltrate or pleural effusion. No pulmonary edema. Diffuse osteopenia.  IMPRESSION: No active disease.   Electronically Signed   By: Lahoma Crocker M.D.   On: 06/24/2014 10:10   Ct Head Wo Contrast  06/24/2014   CLINICAL DATA:  Left hip pain after being found on floor. Initial encounter.  EXAM: CT HEAD WITHOUT CONTRAST  CT CERVICAL SPINE WITHOUT CONTRAST  TECHNIQUE: Multidetector CT imaging of the head and cervical spine was performed following the standard protocol without intravenous contrast. Multiplanar CT image reconstructions of the cervical spine were also generated.  COMPARISON:  04/21/2014 head CT  FINDINGS: CT HEAD FINDINGS  Skull and Sinuses:No evidence of fracture.  There is a chronic small fluid level in the right sphenoid sinus without mucosal thickening. Surgical clips in the left pterygopalatine fossa 0 without abnormal density or expansion compared to prior.  Orbits: Bilateral cataract resection.  No traumatic findings.  Brain: No evidence of acute infarction, hemorrhage, hydrocephalus, or mass lesion/mass effect.  There is chronic cortical and subcortical gliosis in the posterior right temporal lobe, remote infarct versus contusion. There is generalized brain atrophy and chronic small vessel disease with diffuse cerebral white matter ischemic gliosis. Stable  indistinct appearance of the putamen related to dilated perivascular spaces on brain MRI in 2009.  CT CERVICAL SPINE FINDINGS  Mild irregularity along the medial aspect of the C1 left lateral mass is likely ligamentous ossification, streak artifact, and degenerative cystic change. No convincing fracture. There is no predental widening or lateral atlantodental asymmetry. No traumatic malalignment - anterolisthesis at C4-5 and retrolisthesis at C5-6 is chronic when compared to 2014. There is mild anterior wedging of T1 which is chronic from 2014.  Advanced and diffuse degenerative change, with disc disease severe at C4-5 to C6-7. There is disc bulge and dorsal ligamentous buckling at C3-4, C4-5, and C5-6 which results in moderate to high-grade spinal canal stenosis - deforming the cord at the lower 2 of these levels. Large hemangioma present in the T2 body, extending into the posterior elements, without evidence of extraosseous soft tissue. No prevertebral swelling or gross cervical canal hematoma.  IMPRESSION: 1. No evidence of acute intracranial injury or cervical spine fracture. 2. Degenerative disc disease with spinal canal stenosis from C3-4 to C5-6, resulting in cord deformation. 3. Brain atrophy and chronic small vessel disease.   Electronically Signed   By: Monte Fantasia M.D.   On: 06/24/2014 12:22   Ct Cervical Spine Wo Contrast  06/24/2014   CLINICAL DATA:  Left hip pain after being found on floor. Initial encounter.  EXAM: CT HEAD WITHOUT CONTRAST  CT CERVICAL SPINE WITHOUT CONTRAST  TECHNIQUE: Multidetector CT imaging of the head and cervical spine was performed following the standard protocol without intravenous contrast. Multiplanar CT image reconstructions of the cervical spine were also generated.  COMPARISON:  04/21/2014 head CT  FINDINGS: CT HEAD FINDINGS  Skull and Sinuses:No evidence of fracture.  There is a chronic small fluid level in the right sphenoid sinus without mucosal thickening.  Surgical clips in the left pterygopalatine fossa 0 without abnormal density or expansion compared to prior.  Orbits: Bilateral cataract resection.  No traumatic findings.  Brain: No evidence of acute infarction, hemorrhage, hydrocephalus, or mass lesion/mass effect.  There is chronic cortical and subcortical gliosis in the posterior right temporal lobe, remote infarct versus contusion. There is generalized brain atrophy and chronic small vessel disease with diffuse cerebral white matter ischemic gliosis. Stable indistinct appearance  of the putamen related to dilated perivascular spaces on brain MRI in 2009.  CT CERVICAL SPINE FINDINGS  Mild irregularity along the medial aspect of the C1 left lateral mass is likely ligamentous ossification, streak artifact, and degenerative cystic change. No convincing fracture. There is no predental widening or lateral atlantodental asymmetry. No traumatic malalignment - anterolisthesis at C4-5 and retrolisthesis at C5-6 is chronic when compared to 2014. There is mild anterior wedging of T1 which is chronic from 2014.  Advanced and diffuse degenerative change, with disc disease severe at C4-5 to C6-7. There is disc bulge and dorsal ligamentous buckling at C3-4, C4-5, and C5-6 which results in moderate to high-grade spinal canal stenosis - deforming the cord at the lower 2 of these levels. Large hemangioma present in the T2 body, extending into the posterior elements, without evidence of extraosseous soft tissue. No prevertebral swelling or gross cervical canal hematoma.  IMPRESSION: 1. No evidence of acute intracranial injury or cervical spine fracture. 2. Degenerative disc disease with spinal canal stenosis from C3-4 to C5-6, resulting in cord deformation. 3. Brain atrophy and chronic small vessel disease.   Electronically Signed   By: Monte Fantasia M.D.   On: 06/24/2014 12:22   Dg Hip Unilat With Pelvis 2-3 Views Left  06/24/2014   CLINICAL DATA:  81 year old male with fall  and confusion. Initial encounter.  EXAM: LEFT HIP (WITH PELVIS) 2-3 VIEWS  COMPARISON:  Intraoperative films of the left hip 04/23/2014 and earlier.  FINDINGS: Sequelae of ORIF of the fractured proximal left femur. Fracture fragment alignment and visible hardware appears stable from the intraoperative images. There is periosteal reaction, but probably no solid osseous union.  Underlying osteopenia. Extensive calcified atherosclerosis including in both lower extremities.  Questionable nondisplaced left inferior pubic ramus fracture (arrow). No other acute pelvic fracture is identified. The proximal right femur is grossly intact. No new fracture of the visible left femur  IMPRESSION: 1. Suggestion of nondisplaced inferior left pubic ramus fracture. 2. ORIF of the proximal left femur. Hardware and alignment appears stable. Periosteal new bone but probably not yet solid osseous union at that fracture site.   Electronically Signed   By: Genevie Ann M.D.   On: 06/24/2014 10:14     EKG Interpretation   Date/Time:  Thursday June 24 2014 09:03:46 EDT Ventricular Rate:  85 PR Interval:    QRS Duration: 130 QT Interval:  383 QTC Calculation: 455 R Axis:   -75 Text Interpretation:  Atrial fibrillation Ventricular premature complex  Nonspecific IVCD with LAD Anterior infarct, old Otherwise no significant  change Confirmed by HARRISON  MD, FORREST ([id number]) on 06/24/2014 9:13:20 AM      MDM   Final diagnoses:  Fall  Closed fracture of multiple pubic rami, left, initial encounter   81 year old male with unwitnessed fall at nursing facility. He does report that injury but unsure of loss of consciousness. He complains of left hip pain. Notably he had a left hip fracture in January 2016. On exam, left leg does appear shortened and internally rotated when compared with right. Pelvis seems stable. He has no external signs of head trauma.  Will obtain CT head/CS, hip films and CXR.  Lab work pending.  Labwork overall  reassuring. CT head negative.  CT cervical spine with degenerative disc disease and canal stenosis of cervical spine which appears chronic in nature.  Chest x-ray clear. Hip films with noted nondisplaced inferior left pubic rami fracture.  Patient's son has arrived-- he is largely  non-weight bearing at this time and has temporarily stopped his physical therapy.  Case discussed with orthopedics, Dr. Rhona Raider-- ok to discharge back to facility, hold-off on PT for another few weeks, back to weight bearing as tolerated.  Patient has been taking tramadol for pain as this does not make him delirious, will refill this.  FU with Dr. Berenice Primas.  Discussed plan with patient, he/she acknowledged understanding and agreed with plan of care.  Return precautions given for new or worsening symptoms.  Case discussed with attending physician, Dr. Aline Brochure, who evaluated patient and agrees with assessment and plan of care.  Larene Pickett, PA-C 06/24/14 [place], MD 06/25/14 [phone]

## 2014-06-24 NOTE — ED Notes (Signed)
Condom cath applied

## 2014-06-24 NOTE — ED Notes (Signed)
Pt placed in gown and in bed. Pt monitored by pulse ox, bp cuff, and 12-lead. 

## 2014-09-07 ENCOUNTER — Emergency Department (HOSPITAL_COMMUNITY): Payer: Medicare PPO

## 2014-09-07 ENCOUNTER — Encounter (HOSPITAL_COMMUNITY): Payer: Self-pay | Admitting: Emergency Medicine

## 2014-09-07 ENCOUNTER — Inpatient Hospital Stay (HOSPITAL_COMMUNITY)
Admission: EM | Admit: 2014-09-07 | Discharge: 2014-09-13 | DRG: 281 | Disposition: A | Discharge status: Skilled Nursing Facility | Payer: Medicare PPO | Attending: Family Medicine | Admitting: Family Medicine

## 2014-09-07 DIAGNOSIS — D649 Anemia, unspecified: Secondary | ICD-10-CM | POA: Diagnosis present

## 2014-09-07 DIAGNOSIS — Z955 Presence of coronary angioplasty implant and graft: Secondary | ICD-10-CM

## 2014-09-07 DIAGNOSIS — Z7901 Long term (current) use of anticoagulants: Secondary | ICD-10-CM

## 2014-09-07 DIAGNOSIS — Z79891 Long term (current) use of opiate analgesic: Secondary | ICD-10-CM

## 2014-09-07 DIAGNOSIS — R7989 Other specified abnormal findings of blood chemistry: Secondary | ICD-10-CM

## 2014-09-07 DIAGNOSIS — Z809 Family history of malignant neoplasm, unspecified: Secondary | ICD-10-CM

## 2014-09-07 DIAGNOSIS — I129 Hypertensive chronic kidney disease with stage 1 through stage 4 chronic kidney disease, or unspecified chronic kidney disease: Secondary | ICD-10-CM | POA: Diagnosis present

## 2014-09-07 DIAGNOSIS — E785 Hyperlipidemia, unspecified: Secondary | ICD-10-CM | POA: Diagnosis present

## 2014-09-07 DIAGNOSIS — Z23 Encounter for immunization: Secondary | ICD-10-CM

## 2014-09-07 DIAGNOSIS — K5790 Diverticulosis of intestine, part unspecified, without perforation or abscess without bleeding: Secondary | ICD-10-CM | POA: Diagnosis present

## 2014-09-07 DIAGNOSIS — D72829 Elevated white blood cell count, unspecified: Secondary | ICD-10-CM | POA: Diagnosis present

## 2014-09-07 DIAGNOSIS — N390 Urinary tract infection, site not specified: Secondary | ICD-10-CM

## 2014-09-07 DIAGNOSIS — I2510 Atherosclerotic heart disease of native coronary artery without angina pectoris: Secondary | ICD-10-CM | POA: Diagnosis present

## 2014-09-07 DIAGNOSIS — Z79899 Other long term (current) drug therapy: Secondary | ICD-10-CM

## 2014-09-07 DIAGNOSIS — I10 Essential (primary) hypertension: Secondary | ICD-10-CM

## 2014-09-07 DIAGNOSIS — Z888 Allergy status to other drugs, medicaments and biological substances status: Secondary | ICD-10-CM | POA: Diagnosis not present

## 2014-09-07 DIAGNOSIS — R531 Weakness: Secondary | ICD-10-CM | POA: Diagnosis present

## 2014-09-07 DIAGNOSIS — F0390 Unspecified dementia without behavioral disturbance: Secondary | ICD-10-CM | POA: Diagnosis present

## 2014-09-07 DIAGNOSIS — R778 Other specified abnormalities of plasma proteins: Secondary | ICD-10-CM | POA: Diagnosis present

## 2014-09-07 DIAGNOSIS — Z66 Do not resuscitate: Secondary | ICD-10-CM | POA: Diagnosis present

## 2014-09-07 DIAGNOSIS — Z8546 Personal history of malignant neoplasm of prostate: Secondary | ICD-10-CM

## 2014-09-07 DIAGNOSIS — Z885 Allergy status to narcotic agent status: Secondary | ICD-10-CM

## 2014-09-07 DIAGNOSIS — Z951 Presence of aortocoronary bypass graft: Secondary | ICD-10-CM

## 2014-09-07 DIAGNOSIS — I482 Chronic atrial fibrillation: Secondary | ICD-10-CM | POA: Diagnosis present

## 2014-09-07 DIAGNOSIS — Z87442 Personal history of urinary calculi: Secondary | ICD-10-CM

## 2014-09-07 DIAGNOSIS — Z87891 Personal history of nicotine dependence: Secondary | ICD-10-CM

## 2014-09-07 DIAGNOSIS — I214 Non-ST elevation (NSTEMI) myocardial infarction: Secondary | ICD-10-CM | POA: Diagnosis present

## 2014-09-07 DIAGNOSIS — N183 Chronic kidney disease, stage 3 (moderate): Secondary | ICD-10-CM | POA: Diagnosis present

## 2014-09-07 LAB — CBC WITH DIFFERENTIAL/PLATELET
BASOS PCT: 0 % (ref 0–1)
Basophils Absolute: 0 10*3/uL (ref 0.0–0.1)
EOS ABS: 0 10*3/uL (ref 0.0–0.7)
EOS PCT: 0 % (ref 0–5)
HCT: 27.5 % — ABNORMAL LOW (ref 39.0–52.0)
Hemoglobin: 8.5 g/dL — ABNORMAL LOW (ref 13.0–17.0)
LYMPHS ABS: 0.7 10*3/uL (ref 0.7–4.0)
Lymphocytes Relative: 6 % — ABNORMAL LOW (ref 12–46)
MCH: 26.1 pg (ref 26.0–34.0)
MCHC: 30.9 g/dL (ref 30.0–36.0)
MCV: 84.4 fL (ref 78.0–100.0)
MONOS PCT: 4 % (ref 3–12)
Monocytes Absolute: 0.5 10*3/uL (ref 0.1–1.0)
NEUTROS PCT: 90 % — AB (ref 43–77)
Neutro Abs: 10.6 10*3/uL — ABNORMAL HIGH (ref 1.7–7.7)
PLATELETS: 171 10*3/uL (ref 150–400)
RBC: 3.26 MIL/uL — ABNORMAL LOW (ref 4.22–5.81)
RDW: 16.8 % — AB (ref 11.5–15.5)
WBC: 11.8 10*3/uL — ABNORMAL HIGH (ref 4.0–10.5)

## 2014-09-07 LAB — COMPREHENSIVE METABOLIC PANEL
ALBUMIN: 3.2 g/dL — AB (ref 3.5–5.0)
ALT: 13 U/L — ABNORMAL LOW (ref 17–63)
AST: 34 U/L (ref 15–41)
Alkaline Phosphatase: 90 U/L (ref 38–126)
Anion gap: 12 (ref 5–15)
BUN: 25 mg/dL — AB (ref 6–20)
CALCIUM: 8.8 mg/dL — AB (ref 8.9–10.3)
CHLORIDE: 108 mmol/L (ref 101–111)
CO2: 21 mmol/L — ABNORMAL LOW (ref 22–32)
Creatinine, Ser: 1.21 mg/dL (ref 0.61–1.24)
GFR calc non Af Amer: 49 mL/min — ABNORMAL LOW (ref >60)
GFR, EST AFRICAN AMERICAN: 56 mL/min — AB (ref >60)
Glucose, Bld: 139 mg/dL — ABNORMAL HIGH (ref 65–99)
POTASSIUM: 3.6 mmol/L (ref 3.5–5.1)
SODIUM: 141 mmol/L (ref 135–145)
Total Bilirubin: 0.6 mg/dL (ref 0.3–1.2)
Total Protein: 6.5 g/dL (ref 6.5–8.1)

## 2014-09-07 LAB — CBC
HCT: 24.6 % — ABNORMAL LOW (ref 39.0–52.0)
HEMOGLOBIN: 7.7 g/dL — AB (ref 13.0–17.0)
MCH: 26.2 pg (ref 26.0–34.0)
MCHC: 31.3 g/dL (ref 30.0–36.0)
MCV: 83.7 fL (ref 78.0–100.0)
Platelets: 156 10*3/uL (ref 150–400)
RBC: 2.94 MIL/uL — AB (ref 4.22–5.81)
RDW: 16.8 % — AB (ref 11.5–15.5)
WBC: 12.2 10*3/uL — ABNORMAL HIGH (ref 4.0–10.5)

## 2014-09-07 LAB — URINALYSIS, ROUTINE W REFLEX MICROSCOPIC
BILIRUBIN URINE: NEGATIVE
Glucose, UA: NEGATIVE mg/dL
HGB URINE DIPSTICK: NEGATIVE
Ketones, ur: NEGATIVE mg/dL
NITRITE: NEGATIVE
PROTEIN: 30 mg/dL — AB
Specific Gravity, Urine: 1.025 (ref 1.005–1.030)
Urobilinogen, UA: 0.2 mg/dL (ref 0.0–1.0)
pH: 5.5 (ref 5.0–8.0)

## 2014-09-07 LAB — TSH: TSH: 1.715 u[IU]/mL (ref 0.350–4.500)

## 2014-09-07 LAB — CREATININE, SERUM
Creatinine, Ser: 1.16 mg/dL (ref 0.61–1.24)
GFR, EST AFRICAN AMERICAN: 59 mL/min — AB (ref >60)
GFR, EST NON AFRICAN AMERICAN: 51 mL/min — AB (ref >60)

## 2014-09-07 LAB — TROPONIN I
TROPONIN I: 2.07 ng/mL — AB (ref <0.031)
Troponin I: 1.62 ng/mL (ref <0.031)

## 2014-09-07 LAB — URINE MICROSCOPIC-ADD ON

## 2014-09-07 MED ORDER — VITAMINS A & D EX OINT
TOPICAL_OINTMENT | CUTANEOUS | Status: AC
  Administered 2014-09-07: 23:00:00
  Filled 2014-09-07: qty 5

## 2014-09-07 MED ORDER — VITAMIN D (ERGOCALCIFEROL) 1.25 MG (50000 UNIT) PO CAPS
50000.0000 [IU] | ORAL_CAPSULE | ORAL | Status: DC
  Administered 2014-09-11: 50000 [IU] via ORAL
  Filled 2014-09-07 (×2): qty 1

## 2014-09-07 MED ORDER — PIPERACILLIN-TAZOBACTAM 3.375 G IVPB
3.3750 g | Freq: Once | INTRAVENOUS | Status: AC
  Administered 2014-09-07: 3.375 g via INTRAVENOUS
  Filled 2014-09-07: qty 50

## 2014-09-07 MED ORDER — TAMSULOSIN HCL 0.4 MG PO CAPS
0.4000 mg | ORAL_CAPSULE | Freq: Every day | ORAL | Status: DC
  Administered 2014-09-07 – 2014-09-12 (×5): 0.4 mg via ORAL
  Filled 2014-09-07 (×9): qty 1

## 2014-09-07 MED ORDER — PANTOPRAZOLE SODIUM 40 MG PO TBEC
40.0000 mg | DELAYED_RELEASE_TABLET | Freq: Every day | ORAL | Status: DC
  Administered 2014-09-07 – 2014-09-13 (×7): 40 mg via ORAL
  Filled 2014-09-07 (×10): qty 1

## 2014-09-07 MED ORDER — DEXTROSE 5 % IV SOLN
1.0000 g | INTRAVENOUS | Status: DC
  Administered 2014-09-07 – 2014-09-09 (×3): 1 g via INTRAVENOUS
  Filled 2014-09-07 (×4): qty 10

## 2014-09-07 MED ORDER — ISOSORBIDE MONONITRATE ER 60 MG PO TB24
60.0000 mg | ORAL_TABLET | Freq: Every day | ORAL | Status: DC
  Administered 2014-09-08 – 2014-09-13 (×6): 60 mg via ORAL
  Filled 2014-09-07 (×7): qty 1

## 2014-09-07 MED ORDER — VITAMIN B-12 1000 MCG PO TABS
1000.0000 ug | ORAL_TABLET | Freq: Every day | ORAL | Status: DC
  Administered 2014-09-07 – 2014-09-13 (×7): 1000 ug via ORAL
  Filled 2014-09-07 (×7): qty 1

## 2014-09-07 MED ORDER — SODIUM CHLORIDE 0.9 % IV BOLUS (SEPSIS)
500.0000 mL | Freq: Once | INTRAVENOUS | Status: AC
  Administered 2014-09-07: 500 mL via INTRAVENOUS

## 2014-09-07 MED ORDER — SENNOSIDES-DOCUSATE SODIUM 8.6-50 MG PO TABS
1.0000 | ORAL_TABLET | Freq: Two times a day (BID) | ORAL | Status: DC
  Administered 2014-09-07 – 2014-09-13 (×11): 1 via ORAL
  Filled 2014-09-07 (×11): qty 1

## 2014-09-07 MED ORDER — POLYSACCHARIDE IRON COMPLEX 150 MG PO CAPS
150.0000 mg | ORAL_CAPSULE | Freq: Every day | ORAL | Status: DC
  Administered 2014-09-07 – 2014-09-13 (×7): 150 mg via ORAL
  Filled 2014-09-07 (×8): qty 1

## 2014-09-07 MED ORDER — VITAMIN B-1 100 MG PO TABS
100.0000 mg | ORAL_TABLET | Freq: Every day | ORAL | Status: DC
  Administered 2014-09-07 – 2014-09-13 (×7): 100 mg via ORAL
  Filled 2014-09-07 (×8): qty 1

## 2014-09-07 MED ORDER — ACETAMINOPHEN 325 MG PO TABS
650.0000 mg | ORAL_TABLET | Freq: Four times a day (QID) | ORAL | Status: DC | PRN
Start: 2014-09-07 — End: 2014-09-13
  Administered 2014-09-09 – 2014-09-11 (×3): 650 mg via ORAL
  Filled 2014-09-07 (×3): qty 2

## 2014-09-07 MED ORDER — HEPARIN SODIUM (PORCINE) 5000 UNIT/ML IJ SOLN
5000.0000 [IU] | Freq: Three times a day (TID) | INTRAMUSCULAR | Status: DC
  Administered 2014-09-07 – 2014-09-13 (×18): 5000 [IU] via SUBCUTANEOUS
  Filled 2014-09-07 (×18): qty 1

## 2014-09-07 MED ORDER — FOLIC ACID 1 MG PO TABS
1.0000 mg | ORAL_TABLET | Freq: Every day | ORAL | Status: DC
  Administered 2014-09-07 – 2014-09-13 (×7): 1 mg via ORAL
  Filled 2014-09-07 (×8): qty 1

## 2014-09-07 MED ORDER — TRAMADOL HCL 50 MG PO TABS
50.0000 mg | ORAL_TABLET | Freq: Two times a day (BID) | ORAL | Status: DC
  Administered 2014-09-07 – 2014-09-13 (×11): 50 mg via ORAL
  Filled 2014-09-07 (×11): qty 1

## 2014-09-07 MED ORDER — ONDANSETRON HCL 4 MG PO TABS: 4.0000 mg | ORAL_TABLET | Freq: Four times a day (QID) | ORAL | Status: DC | PRN

## 2014-09-07 MED ORDER — VANCOMYCIN HCL IN DEXTROSE 1-5 GM/200ML-% IV SOLN: 1000.0000 mg | INTRAVENOUS | Status: DC

## 2014-09-07 MED ORDER — ACETAMINOPHEN 650 MG RE SUPP: 650.0000 mg | Freq: Four times a day (QID) | RECTAL | Status: DC | PRN

## 2014-09-07 MED ORDER — MUSCLE RUB 10-15 % EX CREA
1.0000 "application " | TOPICAL_CREAM | Freq: Every day | CUTANEOUS | Status: DC | PRN
  Administered 2014-09-10 – 2014-09-12 (×3): 1 via TOPICAL
  Filled 2014-09-07: qty 85

## 2014-09-07 MED ORDER — ONDANSETRON HCL 4 MG/2ML IJ SOLN: 4.0000 mg | Freq: Four times a day (QID) | INTRAMUSCULAR | Status: DC | PRN

## 2014-09-07 MED ORDER — METOPROLOL SUCCINATE ER 100 MG PO TB24
100.0000 mg | ORAL_TABLET | Freq: Every day | ORAL | Status: DC
  Administered 2014-09-07: 100 mg via ORAL
  Filled 2014-09-07 (×2): qty 1

## 2014-09-07 MED ORDER — LISINOPRIL 10 MG PO TABS
10.0000 mg | ORAL_TABLET | Freq: Two times a day (BID) | ORAL | Status: DC
  Administered 2014-09-07: 10 mg via ORAL
  Filled 2014-09-07 (×2): qty 1

## 2014-09-07 MED ORDER — ADULT MULTIVITAMIN W/MINERALS CH
1.0000 | ORAL_TABLET | Freq: Every day | ORAL | Status: DC
  Administered 2014-09-07 – 2014-09-13 (×7): 1 via ORAL
  Filled 2014-09-07 (×7): qty 1

## 2014-09-07 MED ORDER — GLUCOSAMINE-CHONDROITIN 500-400 MG PO TABS: 2.0000 | ORAL_TABLET | Freq: Every day | ORAL | Status: DC

## 2014-09-07 MED ORDER — VANCOMYCIN HCL IN DEXTROSE 1-5 GM/200ML-% IV SOLN
1000.0000 mg | Freq: Once | INTRAVENOUS | Status: AC
  Administered 2014-09-07: 1000 mg via INTRAVENOUS
  Filled 2014-09-07: qty 200

## 2014-09-07 MED ORDER — SODIUM CHLORIDE 0.9 % IV SOLN
INTRAVENOUS | Status: DC
  Administered 2014-09-07 – 2014-09-13 (×8): via INTRAVENOUS

## 2014-09-07 MED ORDER — SODIUM CHLORIDE 0.9 % IJ SOLN
3.0000 mL | Freq: Two times a day (BID) | INTRAMUSCULAR | Status: DC
  Administered 2014-09-09 – 2014-09-12 (×2): 3 mL via INTRAVENOUS

## 2014-09-07 NOTE — Progress Notes (Signed)
ANTIBIOTIC CONSULT NOTE - INITIAL  Pharmacy Consult for ceftriaxone Indication: urinary tract infection  Allergies  Allergen Reactions  . Iodine     Throat swelling  . Dilaudid [Hydromorphone Hcl]     confusion  . Fentanyl     Confusion     Patient Measurements: Height: 5\' 6"  (167.6 cm) Weight: 126 lb 3.2 oz (57.244 kg) IBW/kg (Calculated) : 63.8  Vital Signs: Temp: 99.9 F (37.7 C) (06/07 2032) Temp Source: Axillary (06/07 2032) BP: 126/55 mmHg (06/07 2032) Pulse Rate: 102 (06/07 2032) Intake/Output from previous day:   Intake/Output from this shift:    Labs:  Recent Labs  09/07/14 1638 09/07/14 2110  WBC 11.8* 12.2*  HGB 8.5* 7.7*  PLT 171 156  CREATININE 1.21  --    Estimated Creatinine Clearance: 28.9 mL/min (by C-G formula based on Cr of 1.21). No results for input(s): VANCOTROUGH, VANCOPEAK, VANCORANDOM, GENTTROUGH, GENTPEAK, GENTRANDOM, TOBRATROUGH, TOBRAPEAK, TOBRARND, AMIKACINPEAK, AMIKACINTROU, AMIKACIN in the last 72 hours.   Microbiology: No results found for this or any previous visit (from the past 720 hour(s)).  Medical History: Past Medical History  Diagnosis Date  . CAD (coronary artery disease)     Dr Gwenlyn Found  . Hyperlipidemia   . Hypertension   . Diverticulosis 2010    with colon bleed  . Personal history of renal calculi 2007  . Pneumonia 2007  . Diverticulitis 01/05/2012    [facility]  . Pernicious anemia   . Dementia   . Atherosclerosis   . Anemia     unspecified  . Prostate cancer     Viadur Implant, Dr. Gaynelle Arabian  . Inguinal hernia   . Ataxic gait     Medications:  Anti-infectives    Start     Dose/Rate Route Frequency Ordered Stop   09/08/14 1000  vancomycin (VANCOCIN) IVPB 1000 mg/200 mL premix  Status:  Discontinued     1,000 mg 200 mL/hr over 60 Minutes Intravenous Every 24 hours 09/07/14 1809 09/07/14 1935   09/07/14 2000  cefTRIAXone (ROCEPHIN) 1 g in dextrose 5 % 50 mL IVPB     1 g 100 mL/hr  over 30 Minutes Intravenous Every 24 hours 09/07/14 1936     09/07/14 1815  vancomycin (VANCOCIN) IVPB 1000 mg/200 mL premix     1,000 mg 200 mL/hr over 60 Minutes Intravenous  Once 09/07/14 1809 09/07/14 1920   09/07/14 1800  piperacillin-tazobactam (ZOSYN) IVPB 3.375 g     3.375 g 12.5 mL/hr over 240 Minutes Intravenous  Once 09/07/14 1748 09/07/14 1901     Assessment: 81 y.o. male with history of dementia chronic atrial fibrillation HTN presents with weakness. Patient is resident of retirement community. He has not been his usual self with increased weakness and also is more confused. Apparently has been having a fever also. Patient was noted to be having a low blood pressure prior to transfer so was sent to the ED. In addition he has a history of anemia due to diverticulosis and has been receiving transfusions as needed. When he presented here to the ED he was noted to have a UTI and is being admitted for this.  Pharmacy to dose ceftriaxone for same   Goal of Therapy:  Eradication of infection Appropriate antibiotic dosing for indication and renal function  Plan:  Day 1 antibiotics  Ceftriaxone 1 g IV q24 hr  Follow clinical course, renal function, culture results as available  Follow for de-escalation of antibiotics and LOT  Reuel Boom, PharmD Pager: [phone] 09/07/2014, 9:55 PM

## 2014-09-07 NOTE — ED Notes (Signed)
Bed: WA04 Expected date:  Expected time:  Means of arrival:  Comments: Septic?

## 2014-09-07 NOTE — ED Notes (Signed)
Family at bedside. 

## 2014-09-07 NOTE — Progress Notes (Signed)
PHARMACIST - PHYSICIAN ORDER COMMUNICATION  CONCERNING: P&T Medication Policy on Herbal Medications  DESCRIPTION:  This patient's order for: glucosamine-chondroitin  has been noted.  This product(s) is classified as an "herbal" or natural product. Due to a lack of definitive safety studies or FDA approval, nonstandard manufacturing practices, plus the potential risk of unknown drug-drug interactions while on inpatient medications, the Pharmacy and Therapeutics Committee does not permit the use of "herbal" or natural products of this type within [facility].   ACTION TAKEN: The pharmacy department is unable to verify this order at this time and your patient has been informed of this safety policy. Please reevaluate patient's clinical condition at discharge and address if the herbal or natural product(s) should be resumed at that time.  Royetta Asal, PharmD, BCPS 09/07/14, 20:41

## 2014-09-07 NOTE — Progress Notes (Signed)
ANTIBIOTIC CONSULT NOTE - INITIAL  Pharmacy Consult for Vancomycin Indication: sepsis  Allergies  Allergen Reactions  . Iodine     Throat swelling  . Dilaudid [Hydromorphone Hcl]     confusion  . Fentanyl     Confusion     Patient Measurements:    Vital Signs: Temp: 100.6 F (38.1 C) (06/07 1705) Temp Source: Rectal (06/07 1705) BP: 141/59 mmHg (06/07 1705) Pulse Rate: 104 (06/07 1705) Intake/Output from previous day:   Intake/Output from this shift:    Labs:  Recent Labs  09/07/14 1638  WBC 11.8*  HGB 8.5*  PLT 171  CREATININE 1.21   CrCl cannot be calculated (Unknown ideal weight.). No results for input(s): VANCOTROUGH, VANCOPEAK, VANCORANDOM, GENTTROUGH, GENTPEAK, GENTRANDOM, TOBRATROUGH, TOBRAPEAK, TOBRARND, AMIKACINPEAK, AMIKACINTROU, AMIKACIN in the last 72 hours.   Microbiology: No results found for this or any previous visit (from the past 720 hour(s)).  Medical History: Past Medical History  Diagnosis Date  . CAD (coronary artery disease)     Dr Gwenlyn Found  . Hyperlipidemia   . Hypertension   . Diverticulosis 2010    with colon bleed  . Personal history of renal calculi 2007  . Pneumonia 2007  . Diverticulitis 01/05/2012    [facility]  . Pernicious anemia   . Dementia   . Atherosclerosis   . Anemia     unspecified  . Prostate cancer     Viadur Implant, Dr. Gaynelle Arabian  . Inguinal hernia   . Ataxic gait     Medications:  Anti-infectives    Start     Dose/Rate Route Frequency Ordered Stop   09/08/14 1000  vancomycin (VANCOCIN) IVPB 1000 mg/200 mL premix     1,000 mg 200 mL/hr over 60 Minutes Intravenous Every 24 hours 09/07/14 1809     09/07/14 1815  vancomycin (VANCOCIN) IVPB 1000 mg/200 mL premix     1,000 mg 200 mL/hr over 60 Minutes Intravenous  Once 09/07/14 1809     09/07/14 1800  piperacillin-tazobactam (ZOSYN) IVPB 3.375 g     3.375 g 12.5 mL/hr over 240 Minutes Intravenous  Once 09/07/14 1748        Assessment: 81 y.o. male presenting  09/07/2014 with weakness; reportedly had fevers up to 101 and the nursing home. Patient states he feels a little bad but otherwise is feeling fine. Wheezing noted in ED.  Has been in [facility] in [place] for the last few days.  To begin vancomycin per pharmacy for sepsis.  Zosyn x 1 in ED   Goal of Therapy:  Vancomycin trough level 15-20 mcg/ml  Eradication of infection Appropriate antibiotic dosing for indication and renal function  Plan:  Day 1 antibiotics Vancomycin 1000 mg IV q24 hr; will stack 1st 2 doses to hasten achievement of therapeutic concentrations Measure vancomycin trough levels at steady state as indicated Anticipate continued orders for Zosyn  Follow clinical course, renal function, culture results as available  Follow for de-escalation of antibiotics and LOT   Reuel Boom, PharmD Pager: [phone] 09/07/2014, 6:10 PM

## 2014-09-07 NOTE — H&P (Signed)
[facility] History and Physical  Jordan Little LFY:[id number] DOB: February 14, 1916 DOA: 09/07/2014  Referring physician: Davonna Belling, MD PCP: [name], MD   Chief Complaint: Weakness  HPI: Jordan Little is a 81 y.o. male with history of dementia chronic atrial fibrillation HTN presents with weakness. Patient is resident of [facility] community. He has not been his usual self with increased weakness and also is more confused. Apparently has been having a fever also. Patient was noted to be having a low blood pressure prior to transfer so was sent to the ED. In addition he has a history of anemia due to diverticulosis and has been receiving transfusions as needed. When he presented here to the ED he was noted to have a UTI and is being admitted for this.    Review of Systems:  Patient is not able to provide any ROS due to cognitive impairment  Past Medical History  Diagnosis Date  . CAD (coronary artery disease)     Dr Gwenlyn Found  . Hyperlipidemia   . Hypertension   . Diverticulosis 2010    with colon bleed  . Personal history of renal calculi 2007  . Pneumonia 2007  . Diverticulitis 01/05/2012    [facility]  . Pernicious anemia   . Dementia   . Atherosclerosis   . Anemia     unspecified  . Prostate cancer     [facility], Dr. Gaynelle Arabian  . Inguinal hernia   . Ataxic gait    Past Surgical History  Procedure Laterality Date  . Coronary angioplasty with stent placement  1999    Dr. Gwenlyn Found, 4 vessels  . Transurethral resection of prostate      Dr.   Marla Roe  . Prostate surgery      Dr. Gaynelle Arabian  . Laparoscopic appendectomy  07/06/2011    Procedure: APPENDECTOMY LAPAROSCOPIC;  Surgeon: Adin Hector, MD;  Location: [facility];  Service: General;  Laterality: N/A;  . Intramedullary (im) nail intertrochanteric Left 04/23/2014    Procedure: LEFT INTRAMEDULLARY (IM) NAIL INTERTROCHANTRIC ;  Surgeon: Alta Corning, MD;  Location: [facility];  Service:  Orthopedics;  Laterality: Left;  . Joint replacement    . Fracture surgery  04/2014    L neck femur   Social History:  reports that he has quit smoking. He quit smokeless tobacco use about 43 years ago. He reports that he does not drink alcohol or use illicit drugs.  Allergies  Allergen Reactions  . Iodine     Throat swelling  . Dilaudid [Hydromorphone Hcl]     confusion  . Fentanyl     Confusion     Family History  Problem Relation Age of Onset  . Cancer Mother   . Cancer Father     prostate     Prior to Admission medications   Medication Sig Start Date End Date Taking? Authorizing Provider  acetaminophen (TYLENOL) 325 MG tablet Take 650 mg by mouth every 4 (four) hours as needed for mild pain or fever.   Yes Historical Provider, MD  acetaminophen (TYLENOL) 650 MG suppository Place 650 mg rectally every 4 (four) hours as needed for mild pain or fever.   Yes Historical Provider, MD  ergocalciferol (VITAMIN D2) 50000 UNITS capsule Take 50,000 Units by mouth once a week. Saturday.   Yes Historical Provider, MD  glucosamine-chondroitin 500-400 MG tablet Take 2 tablets by mouth daily.    Yes Historical Provider, MD  iron polysaccharides (NIFEREX) 150  MG capsule Take 150 mg by mouth daily.   Yes Historical Provider, MD  isosorbide mononitrate (IMDUR) 60 MG 24 hr tablet Take 60 mg by mouth daily.     Yes Historical Provider, MD  lisinopril (PRINIVIL,ZESTRIL) 10 MG tablet Take 10 mg by mouth 2 (two) times daily.   Yes Historical Provider, MD  Menthol, Topical Analgesic, 4 % GEL Apply 1 inch topically daily as needed (hip/knee pain).   Yes Historical Provider, MD  metoprolol succinate (TOPROL-XL) 100 MG 24 hr tablet Take 100 mg by mouth daily. Take with or immediately following a meal.   Yes Historical Provider, MD  NUTRITIONAL SUPPLEMENTS PO Take 1 Container by mouth 3 (three) times daily with meals. Offer supplement of choice 3 times daily with meals.   Yes Historical Provider, MD    pantoprazole (PROTONIX) 40 MG tablet Take 40 mg by mouth daily.   Yes Historical Provider, MD  senna-docusate (SENOKOT-S) 8.6-50 MG per tablet Take 1 tablet by mouth 2 (two) times daily.   Yes Historical Provider, MD  tamsulosin (FLOMAX) 0.4 MG CAPS capsule Take 1 capsule (0.4 mg total) by mouth daily after supper. 04/26/14  Yes Verlee Monte, MD  traMADol (ULTRAM) 50 MG tablet Take 1 tablet (50 mg total) by mouth every 6 (six) hours as needed. Patient taking differently: Take 50 mg by mouth every 6 (six) hours as needed for moderate pain.  06/24/14  Yes Larene Pickett, PA-C  traMADol (ULTRAM) 50 MG tablet Take 50 mg by mouth 2 (two) times daily.   Yes Historical Provider, MD  vitamin B-12 (CYANOCOBALAMIN) 1000 MCG tablet Take 1,000 mcg by mouth daily.   Yes Historical Provider, MD  enoxaparin (LOVENOX) 40 MG/0.4ML injection Inject 0.4 mLs (40 mg total) into the skin daily. Patient not taking: Reported on 09/07/2014 04/26/14   Verlee Monte, MD  HYDROcodone-acetaminophen (NORCO) 5-325 MG per tablet Take 1 tablet by mouth every 8 (eight) hours as needed for severe pain. Patient not taking: Reported on 09/07/2014 04/23/14   Gary Fleet, PA-C  oxybutynin (DITROPAN-XL) 10 MG 24 hr tablet TAKE ONE TABLET BY MOUTH ONE TIME DAILY Patient not taking: Reported on 09/07/2014 01/16/12   Hendricks Limes, MD   Physical Exam: Filed Vitals:   09/07/14 1549 09/07/14 1550 09/07/14 1651 09/07/14 1705  BP:  108/61 130/54 141/59  Pulse:   98 104  Temp:  99.6 F (37.6 C)  100.6 F (38.1 C)  TempSrc:  Oral  Rectal  Resp:  18 35 26  SpO2: 96% 96% 97% 98%    Wt Readings from Last 3 Encounters:  01/14/12 68.312 kg (150 lb 9.6 oz)  01/05/12 68.04 kg (150 lb)  07/17/11 68.04 kg (150 lb)    General:  Appears calm and comfortable Eyes: PERRL, normal lids, irises & conjunctiva ENT: grossly normal hearing, lips & tongue Neck: no LAD, masses or thyromegaly Cardiovascular: IRR, no m/r/g Respiratory: CTA bilaterally,  no w/r/r. Normal respiratory effort. Abdomen: soft, ntnd Skin: no rash or induration seen on limited exam Musculoskeletal: grossly normal tone BUE/BLE Psychiatric: unable to assess due to cognitive impairment Neurologic: grossly moving all four extremities          Labs on Admission:  Basic Metabolic Panel:  Recent Labs Lab 09/07/14 1638  NA 141  K 3.6  CL 108  CO2 21*  GLUCOSE 139*  BUN 25*  CREATININE 1.21  CALCIUM 8.8*   Liver Function Tests:  Recent Labs Lab 09/07/14 1638  AST 34  ALT 13*  ALKPHOS 90  BILITOT 0.6  PROT 6.5  ALBUMIN 3.2*   No results for input(s): LIPASE, AMYLASE in the last 168 hours. No results for input(s): AMMONIA in the last 168 hours. CBC:  Recent Labs Lab 09/07/14 1638  WBC 11.8*  NEUTROABS 10.6*  HGB 8.5*  HCT 27.5*  MCV 84.4  PLT 171   Cardiac Enzymes:  Recent Labs Lab 09/07/14 1638  TROPONINI 1.62*    BNP (last 3 results) No results for input(s): BNP in the last 8760 hours.  ProBNP (last 3 results) No results for input(s): PROBNP in the last 8760 hours.  CBG: No results for input(s): GLUCAP in the last 168 hours.  Radiological Exams on Admission: Dg Chest Portable 1 View  09/07/2014   CLINICAL DATA:  Increasing weakness and decreased activity over past 24 hours, fever of 101 degrees this afternoon, history hypertension, coronary artery disease, dementia, prostate cancer, former smoker  EXAM: PORTABLE CHEST - 1 VIEW  COMPARISON:  Portable exam 1636 hours compared to 06/24/2014  FINDINGS: Enlargement of cardiac silhouette with pulmonary vascular congestion.  Mediastinal contours and pulmonary vascularity normal.  Peribronchial thickening without infiltrate, pleural effusion or pneumothorax.  Bones demineralized.  IMPRESSION: Enlargement of cardiac silhouette with pulmonary vascular congestion.  Minimal bronchitic changes.   Electronically Signed   By: Lavonia Dana M.D.   On: 09/07/2014 16:45       Assessment/Plan Principal Problem:   UTI (lower urinary tract infection) Active Problems:   Essential hypertension   Elevated troponin   Dementia without behavioral disturbance   Anemia   1. UTI -cultures drawn -will start on IV rocephin daily -will hydrate with IVF NS and monitor labs  2. Essential Hypertension -monitor pressures  -continue with lopressor lisinopril  3. CKD III -monitor labs -will hydrate with IVF NS as ordered  4. Elevated troponin -will cycle enzymes -no obvious complaints of chest pain  5. Dementia -will monitor currently not agitated  6. Anemia from Diverticulosis -will check iron studies -check B12 folate -will check stool occult blood -will continue with iron -repeat labs in am and transfuse as needed  7 Hyperglycemia -will monitor FSBS and cover with SSI as needed    Code Status: DNR (must indicate code status--if unknown or must be presumed, indicate so) DVT Prophylaxis:Heparin Family Communication: None (indicate person spoken with, if applicable, with phone number if by telephone) Disposition Plan: SNF (indicate anticipated LOS)  Time spent: 4min  Tanijah Morais A [facility] Pager [phone]

## 2014-09-07 NOTE — ED Notes (Signed)
Pt from [facility] in [facility]

## 2014-09-07 NOTE — ED Notes (Addendum)
Over the past 24 hours pt has been experiencing increasing weakness and decreased activity with a fever of 101 this afternoon. Nurse at SNF gave tylenol and ultram sometime this morning. Per EMS states there is mild weakness to the left side, one nurse stated it was old, another stated it was new, neither know the onset time of the weakness.

## 2014-09-07 NOTE — ED Notes (Signed)
Patient is unable to urinate on his own.

## 2014-09-07 NOTE — ED Notes (Signed)
Pt can go to the floor at 18:50

## 2014-09-07 NOTE — ED Provider Notes (Signed)
CSN: [id number]     Arrival date & time 09/07/14  89 History   First MD Initiated Contact with Patient 09/07/14 1609     Chief Complaint  Patient presents with  . Weakness    Low 5 caveat due to dementia. (Consider location/radiation/quality/duration/timing/severity/associated sxs/prior Treatment) Patient is a 81 y.o. male presenting with weakness. The history is provided by the patient and the nursing home.  Weakness Pertinent negatives include no shortness of breath.   patient was sent to the ER for generalized weakness. Reportedly had fevers up to 101 and the nursing home. Patient states he feels a little bad but otherwise is feeling fine. He denies cough sounds wheezy on exam. Denies dysuria. States he's had a good appetite. States he has been in [facility] in [place] for the last few days.  Past Medical History  Diagnosis Date  . CAD (coronary artery disease)     Dr Gwenlyn Found  . Hyperlipidemia   . Hypertension   . Diverticulosis 2010    with colon bleed  . Personal history of renal calculi 2007  . Pneumonia 2007  . Diverticulitis 01/05/2012    [facility]  . Pernicious anemia   . Dementia   . Atherosclerosis   . Anemia     unspecified  . Prostate cancer     [facility], Dr. Gaynelle Arabian  . Inguinal hernia   . Ataxic gait    Past Surgical History  Procedure Laterality Date  . Coronary angioplasty with stent placement  1999    Dr. Gwenlyn Found, 4 vessels  . Transurethral resection of prostate      Dr.   Marla Roe  . Prostate surgery      Dr. Gaynelle Arabian  . Laparoscopic appendectomy  07/06/2011    Procedure: APPENDECTOMY LAPAROSCOPIC;  Surgeon: Adin Hector, MD;  Location: [facility];  Service: General;  Laterality: N/A;  . Intramedullary (im) nail intertrochanteric Left 04/23/2014    Procedure: LEFT INTRAMEDULLARY (IM) NAIL INTERTROCHANTRIC ;  Surgeon: Alta Corning, MD;  Location: [facility];  Service: Orthopedics;  Laterality: Left;  . Joint replacement    .  Fracture surgery  04/2014    L neck femur   Family History  Problem Relation Age of Onset  . Cancer Mother   . Cancer Father     prostate   History  Substance Use Topics  . Smoking status: Former Research scientist (life sciences)  . Smokeless tobacco: Former Systems developer    Quit date: 07/17/1971  . Alcohol Use: No    Review of Systems  Unable to perform ROS Eyes: Negative for pain.  Respiratory: Negative for shortness of breath.   Neurological: Positive for weakness.      Allergies  Iodine; Dilaudid; and Fentanyl  Home Medications   Prior to Admission medications   Medication Sig Start Date End Date Taking? Authorizing Provider  acetaminophen (TYLENOL) 325 MG tablet Take 650 mg by mouth every 4 (four) hours as needed for mild pain or fever.   Yes Historical Provider, MD  acetaminophen (TYLENOL) 650 MG suppository Place 650 mg rectally every 4 (four) hours as needed for mild pain or fever.   Yes Historical Provider, MD  ergocalciferol (VITAMIN D2) 50000 UNITS capsule Take 50,000 Units by mouth once a week. Saturday.   Yes Historical Provider, MD  glucosamine-chondroitin 500-400 MG tablet Take 2 tablets by mouth daily.    Yes Historical Provider, MD  iron polysaccharides (NIFEREX) 150 MG capsule Take 150 mg by mouth daily.  Yes Historical Provider, MD  isosorbide mononitrate (IMDUR) 60 MG 24 hr tablet Take 60 mg by mouth daily.     Yes Historical Provider, MD  lisinopril (PRINIVIL,ZESTRIL) 10 MG tablet Take 10 mg by mouth 2 (two) times daily.   Yes Historical Provider, MD  Menthol, Topical Analgesic, 4 % GEL Apply 1 inch topically daily as needed (hip/knee pain).   Yes Historical Provider, MD  metoprolol succinate (TOPROL-XL) 100 MG 24 hr tablet Take 100 mg by mouth daily. Take with or immediately following a meal.   Yes Historical Provider, MD  NUTRITIONAL SUPPLEMENTS PO Take 1 Container by mouth 3 (three) times daily with meals. Offer supplement of choice 3 times daily with meals.   Yes Historical Provider,  MD  pantoprazole (PROTONIX) 40 MG tablet Take 40 mg by mouth daily.   Yes Historical Provider, MD  senna-docusate (SENOKOT-S) 8.6-50 MG per tablet Take 1 tablet by mouth 2 (two) times daily.   Yes Historical Provider, MD  tamsulosin (FLOMAX) 0.4 MG CAPS capsule Take 1 capsule (0.4 mg total) by mouth daily after supper. 04/26/14  Yes Verlee Monte, MD  traMADol (ULTRAM) 50 MG tablet Take 1 tablet (50 mg total) by mouth every 6 (six) hours as needed. Patient taking differently: Take 50 mg by mouth every 6 (six) hours as needed for moderate pain.  06/24/14  Yes Larene Pickett, PA-C  traMADol (ULTRAM) 50 MG tablet Take 50 mg by mouth 2 (two) times daily.   Yes Historical Provider, MD  vitamin B-12 (CYANOCOBALAMIN) 1000 MCG tablet Take 1,000 mcg by mouth daily.   Yes Historical Provider, MD  enoxaparin (LOVENOX) 40 MG/0.4ML injection Inject 0.4 mLs (40 mg total) into the skin daily. Patient not taking: Reported on 09/07/2014 04/26/14   Verlee Monte, MD  HYDROcodone-acetaminophen (NORCO) 5-325 MG per tablet Take 1 tablet by mouth every 8 (eight) hours as needed for severe pain. Patient not taking: Reported on 09/07/2014 04/23/14   Gary Fleet, PA-C  oxybutynin (DITROPAN-XL) 10 MG 24 hr tablet TAKE ONE TABLET BY MOUTH ONE TIME DAILY Patient not taking: Reported on 09/07/2014 01/16/12   Hendricks Limes, MD   BP 126/55 mmHg  Pulse 102  Temp(Src) 99.9 F (37.7 C) (Axillary)  Resp 22  Ht 5\' 6"  (1.676 m)  Wt 126 lb 3.2 oz (57.244 kg)  BMI 20.38 kg/m2  SpO2 92% Physical Exam  Constitutional: He appears well-developed and well-nourished.  HENT:  Head: Atraumatic.  Eyes: Pupils are equal, round, and reactive to light.  Neck: Neck supple.  Cardiovascular: Normal rate.   Pulmonary/Chest: He has wheezes.  Abdominal: He exhibits distension.  Mild distention without frank tenderness  Neurological: He is alert.  Awake and pleasant but mild confusion  Skin: Skin is warm.    ED Course  Procedures (including  critical care time) Labs Review Labs Reviewed  COMPREHENSIVE METABOLIC PANEL - Abnormal; Notable for the following:    CO2 21 (*)    Glucose, Bld 139 (*)    BUN 25 (*)    Calcium 8.8 (*)    Albumin 3.2 (*)    ALT 13 (*)    GFR calc non Af Amer 49 (*)    GFR calc Af Amer 56 (*)    All other components within normal limits  CBC WITH DIFFERENTIAL/PLATELET - Abnormal; Notable for the following:    WBC 11.8 (*)    RBC 3.26 (*)    Hemoglobin 8.5 (*)    HCT 27.5 (*)  RDW 16.8 (*)    Neutrophils Relative % 90 (*)    Lymphocytes Relative 6 (*)    Neutro Abs 10.6 (*)    All other components within normal limits  TROPONIN I - Abnormal; Notable for the following:    Troponin I 1.62 (*)    All other components within normal limits  URINALYSIS, ROUTINE W REFLEX MICROSCOPIC (NOT AT [facility]) - Abnormal; Notable for the following:    APPearance CLOUDY (*)    Protein, ur 30 (*)    Leukocytes, UA SMALL (*)    All other components within normal limits  URINE MICROSCOPIC-ADD ON - Abnormal; Notable for the following:    Bacteria, UA MANY (*)    All other components within normal limits  CBC - Abnormal; Notable for the following:    WBC 12.2 (*)    RBC 2.94 (*)    Hemoglobin 7.7 (*)    HCT 24.6 (*)    RDW 16.8 (*)    All other components within normal limits  CREATININE, SERUM - Abnormal; Notable for the following:    GFR calc non Af Amer 51 (*)    GFR calc Af Amer 59 (*)    All other components within normal limits  TROPONIN I - Abnormal; Notable for the following:    Troponin I 2.07 (*)    All other components within normal limits  URINE CULTURE  MRSA PCR SCREENING  TSH  IRON AND TIBC  FERRITIN  VITAMIN B12  FOLATE RBC  TROPONIN I  TROPONIN I  HEMOGLOBIN A1C  CBC  COMPREHENSIVE METABOLIC PANEL    Imaging Review Dg Chest Portable 1 View  09/07/2014   CLINICAL DATA:  Increasing weakness and decreased activity over past 24 hours, fever of 101 degrees this afternoon, history  hypertension, coronary artery disease, dementia, prostate cancer, former smoker  EXAM: PORTABLE CHEST - 1 VIEW  COMPARISON:  Portable exam 1636 hours compared to 06/24/2014  FINDINGS: Enlargement of cardiac silhouette with pulmonary vascular congestion.  Mediastinal contours and pulmonary vascularity normal.  Peribronchial thickening without infiltrate, pleural effusion or pneumothorax.  Bones demineralized.  IMPRESSION: Enlargement of cardiac silhouette with pulmonary vascular congestion.  Minimal bronchitic changes.   Electronically Signed   By: Lavonia Dana M.D.   On: 09/07/2014 16:45     EKG Interpretation   Date/Time:  Tuesday September 07 2014 15:54:18 EDT Ventricular Rate:  94 PR Interval:    QRS Duration: 127 QT Interval:  387 QTC Calculation: 484 R Axis:   -25 Text Interpretation:  Atrial fibrillation Left bundle branch block  Confirmed by Taneya Conkel  MD, Ovid Curd [id number]) on 09/07/2014 4:55:01 PM      MDM   Final diagnoses:  UTI (lower urinary tract infection)  Elevated troponin    Patient generalized weakness. Has apparent urinary tract infection. EKG shows an A. fib that is known. Does have somewhat elevated troponin. Denied chest pain. Will admit to internal medicine. Patient is a DO NOT RESUSCITATE.    Davonna Belling, MD 09/08/14 [phone]

## 2014-09-08 DIAGNOSIS — N390 Urinary tract infection, site not specified: Secondary | ICD-10-CM

## 2014-09-08 LAB — COMPREHENSIVE METABOLIC PANEL
ALK PHOS: 71 U/L (ref 38–126)
ALT: 14 U/L — ABNORMAL LOW (ref 17–63)
ANION GAP: 8 (ref 5–15)
AST: 40 U/L (ref 15–41)
Albumin: 2.6 g/dL — ABNORMAL LOW (ref 3.5–5.0)
BILIRUBIN TOTAL: 0.6 mg/dL (ref 0.3–1.2)
BUN: 26 mg/dL — AB (ref 6–20)
CALCIUM: 8.1 mg/dL — AB (ref 8.9–10.3)
CHLORIDE: 109 mmol/L (ref 101–111)
CO2: 21 mmol/L — ABNORMAL LOW (ref 22–32)
CREATININE: 1.09 mg/dL (ref 0.61–1.24)
GFR calc Af Amer: >60 mL/min (ref >60)
GFR calc non Af Amer: 55 mL/min — ABNORMAL LOW (ref >60)
Glucose, Bld: 141 mg/dL — ABNORMAL HIGH (ref 65–99)
Potassium: 3.6 mmol/L (ref 3.5–5.1)
Sodium: 138 mmol/L (ref 135–145)
Total Protein: 5.5 g/dL — ABNORMAL LOW (ref 6.5–8.1)

## 2014-09-08 LAB — MRSA PCR SCREENING: MRSA by PCR: NEGATIVE

## 2014-09-08 LAB — CBC
HEMATOCRIT: 23.4 % — AB (ref 39.0–52.0)
Hemoglobin: 7.3 g/dL — ABNORMAL LOW (ref 13.0–17.0)
MCH: 26 pg (ref 26.0–34.0)
MCHC: 31.2 g/dL (ref 30.0–36.0)
MCV: 83.3 fL (ref 78.0–100.0)
PLATELETS: 145 10*3/uL — AB (ref 150–400)
RBC: 2.81 MIL/uL — ABNORMAL LOW (ref 4.22–5.81)
RDW: 16.6 % — ABNORMAL HIGH (ref 11.5–15.5)
WBC: 15.9 10*3/uL — ABNORMAL HIGH (ref 4.0–10.5)

## 2014-09-08 LAB — FERRITIN: Ferritin: 60 ng/mL (ref 24–336)

## 2014-09-08 LAB — TROPONIN I
Troponin I: 4.23 ng/mL (ref <0.031)
Troponin I: 4.48 ng/mL (ref <0.031)

## 2014-09-08 LAB — VITAMIN B12: VITAMIN B 12: 1238 pg/mL — AB (ref 180–914)

## 2014-09-08 LAB — IRON AND TIBC
Iron: 6 ug/dL — ABNORMAL LOW (ref 45–182)
SATURATION RATIOS: 2 % — AB (ref 17.9–39.5)
TIBC: 274 ug/dL (ref 250–450)
UIBC: 268 ug/dL

## 2014-09-08 LAB — ABO/RH: ABO/RH(D): O POS

## 2014-09-08 LAB — GLUCOSE, CAPILLARY: GLUCOSE-CAPILLARY: 113 mg/dL — AB (ref 65–99)

## 2014-09-08 MED ORDER — METOPROLOL SUCCINATE ER 25 MG PO TB24
12.5000 mg | ORAL_TABLET | Freq: Every day | ORAL | Status: DC
  Administered 2014-09-09 – 2014-09-10 (×2): 12.5 mg via ORAL
  Filled 2014-09-08 (×2): qty 1

## 2014-09-08 MED ORDER — ENSURE ENLIVE PO LIQD
237.0000 mL | Freq: Three times a day (TID) | ORAL | Status: DC
  Administered 2014-09-08 – 2014-09-13 (×12): 237 mL via ORAL

## 2014-09-08 MED ORDER — SODIUM CHLORIDE 0.9 % IV SOLN: Freq: Once | INTRAVENOUS | Status: DC

## 2014-09-08 MED ORDER — ASPIRIN 325 MG PO TABS: 325.0000 mg | ORAL_TABLET | Freq: Every day | ORAL | Status: DC

## 2014-09-08 NOTE — Progress Notes (Signed)
CRITICAL VALUE ALERT  Critical value received:  Troponin 4.23  Date of notification:  09/08/2014  Time of notification:  0339  Critical value read back:Yes.    Nurse who received alert:  L. Vergel de Larkin Ina RN  MD notified (1st page):  Lamar Blinks NP  Time of first page:  [phone]  MD notified (2nd page):  Time of second page:  Responding MD:  Lamar Blinks NP  Time MD responded:  [phone]

## 2014-09-08 NOTE — Care Management Note (Signed)
Case Management Note  Patient Details  Name: Jordan Little MRN: [id number] Date of Birth: 05/10/1915  Subjective/Objective:  81 y/o m admitted w/UTI. DNR.From SNF.                  Action/Plan:d/c plan return SNF.   Expected Discharge Date:   (unknown)               Expected Discharge Plan:  Skilled Nursing Facility  In-House Referral:  Clinical Social Work  Discharge planning Services  CM Consult  Post Acute Care Choice:    Choice offered to:     DME Arranged:    DME Agency:     HH Arranged:    [place] Agency:     Status of Service:  In process, will continue to follow  Medicare Important Message Given:    Date Medicare IM Given:    Medicare IM give by:    Date Additional Medicare IM Given:    Additional Medicare Important Message give by:     If discussed at [place] of Stay Meetings, dates discussed:    Additional Comments:  Dessa Phi, RN 09/08/2014, 1:09 PM

## 2014-09-08 NOTE — Progress Notes (Signed)
[facility] PROGRESS NOTE  CROY DRUMWRIGHT AOZ:[id number] DOB: September 17, 1915 DOA: 09/07/2014 PCP: [name], MD  Assessment/Plan:  Principal Problem:   UTI (lower urinary tract infection) - Currently being treated with ceftriaxone - We'll obtain urine culture results currently pending  Active Problems:   Essential hypertension - Soft blood pressures as such was sent up will have to be held - Decrease beta blocker dose    Elevated troponin - Continue beta blocker at decreased dose - Given GI bleed history and able to provide aspirin    Dementia without behavioral disturbance - Stable    Anemia - Will reassess hemoglobin levels next a.m. - Given cardiac history and GI history we'll plan on transfusing 1 unit crit blood cells  Code Status: DO NOT RESUSCITATE Family Communication: No family at bedside Disposition Plan: pending improvement in condition, ok to receive blood per discussion with son   Consultants:  None  Procedures:  none  Antibiotics:  Rocephin   HPI/Subjective: Pt denies any chest pain  Objective: Filed Vitals:   09/08/14 1359  BP: 102/56  Pulse: 67  Temp: 97.8 F (36.6 C)  Resp: 18    Intake/Output Summary (Last 24 hours) at 09/08/14 1649 Last data filed at 09/08/14 1038  Gross per 24 hour  Intake   1170 ml  Output      0 ml  Net   1170 ml   Filed Weights   09/07/14 2032  Weight: 57.244 kg (126 lb 3.2 oz)    Exam:   General:  Pt in nad, alert and awake  Cardiovascular: s1 and s2 wnl, no rubs  Respiratory: cta bl, no wheezes  Abdomen: soft, ND, NT  Musculoskeletal: no cyanosis   Data Reviewed: Basic Metabolic Panel:  Recent Labs Lab 09/07/14 1638 09/07/14 2110 09/08/14 0243  NA 141  --  138  K 3.6  --  3.6  CL 108  --  109  CO2 21*  --  21*  GLUCOSE 139*  --  141*  BUN 25*  --  26*  CREATININE 1.21 1.16 1.09  CALCIUM 8.8*  --  8.1*   Liver Function Tests:  Recent Labs Lab 09/07/14 1638  09/08/14 0243  AST 34 40  ALT 13* 14*  ALKPHOS 90 71  BILITOT 0.6 0.6  PROT 6.5 5.5*  ALBUMIN 3.2* 2.6*   No results for input(s): LIPASE, AMYLASE in the last 168 hours. No results for input(s): AMMONIA in the last 168 hours. CBC:  Recent Labs Lab 09/07/14 1638 09/07/14 2110 09/08/14 0243  WBC 11.8* 12.2* 15.9*  NEUTROABS 10.6*  --   --   HGB 8.5* 7.7* 7.3*  HCT 27.5* 24.6* 23.4*  MCV 84.4 83.7 83.3  PLT 171 156 145*   Cardiac Enzymes:  Recent Labs Lab 09/07/14 1638 09/07/14 2110 09/08/14 0243 09/08/14 0821  TROPONINI 1.62* 2.07* 4.23* 4.48*   BNP (last 3 results) No results for input(s): BNP in the last 8760 hours.  ProBNP (last 3 results) No results for input(s): PROBNP in the last 8760 hours.  CBG:  Recent Labs Lab 09/08/14 0726  GLUCAP 113*    Recent Results (from the past 240 hour(s))  MRSA PCR Screening     Status: None   Collection Time: 09/07/14 10:45 PM  Result Value Ref Range Status   MRSA by PCR NEGATIVE NEGATIVE Final    Comment:        The GeneXpert MRSA Assay (FDA approved for NASAL specimens only), is one component of  a comprehensive MRSA colonization surveillance program. It is not intended to diagnose MRSA infection nor to guide or monitor treatment for MRSA infections.      Studies: Dg Chest Portable 1 View  09/07/2014   CLINICAL DATA:  Increasing weakness and decreased activity over past 24 hours, fever of 101 degrees this afternoon, history hypertension, coronary artery disease, dementia, prostate cancer, former smoker  EXAM: PORTABLE CHEST - 1 VIEW  COMPARISON:  Portable exam 1636 hours compared to 06/24/2014  FINDINGS: Enlargement of cardiac silhouette with pulmonary vascular congestion.  Mediastinal contours and pulmonary vascularity normal.  Peribronchial thickening without infiltrate, pleural effusion or pneumothorax.  Bones demineralized.  IMPRESSION: Enlargement of cardiac silhouette with pulmonary vascular congestion.   Minimal bronchitic changes.   Electronically Signed   By: Lavonia Dana M.D.   On: 09/07/2014 16:45    Scheduled Meds: . cefTRIAXone (ROCEPHIN)  IV  1 g Intravenous Q24H  . feeding supplement (ENSURE ENLIVE)  237 mL Oral TID BM  . folic acid  1 mg Oral Daily  . heparin  5,000 Units Subcutaneous 3 times per day  . iron polysaccharides  150 mg Oral Daily  . isosorbide mononitrate  60 mg Oral Daily  . [START ON 09/09/2014] metoprolol succinate  12.5 mg Oral Daily  . multivitamin with minerals  1 tablet Oral Daily  . pantoprazole  40 mg Oral Daily  . senna-docusate  1 tablet Oral BID  . sodium chloride  3 mL Intravenous Q12H  . tamsulosin  0.4 mg Oral QPC supper  . thiamine  100 mg Oral Daily  . traMADol  50 mg Oral BID  . vitamin B-12  1,000 mcg Oral Daily  . [START ON 09/11/2014] Vitamin D (Ergocalciferol)  50,000 Units Oral Weekly   Continuous Infusions: . sodium chloride 50 mL/hr at 09/07/14 2100     Time spent: > 35 minutes    Velvet Bathe  [facility] Pager [phone]. If 7PM-7AM, please contact night-coverage at [email or web address], password [facility] 09/08/2014, 4:49 PM  LOS: 1 day

## 2014-09-08 NOTE — Progress Notes (Signed)
Initial Nutrition Assessment  DOCUMENTATION CODES:  Not applicable  INTERVENTION: - Will order Ensure Enlive po TID, each supplement provides 350 kcal and 20 grams of protein - Encourage PO intakes of meals and supplements - RD will continue to monitor for needs  NUTRITION DIAGNOSIS:  Inadequate oral intake related to other (see comment) (meal refusal) as evidenced by meal completion < 25%.  GOAL:  Patient will meet greater than or equal to 90% of their needs  MONITOR:  PO intake, Supplement acceptance, Weight trends, Labs, I & O's  REASON FOR ASSESSMENT:  Malnutrition Screening Tool  ASSESSMENT: 81 y.o. male with history of dementia chronic atrial fibrillation HTN presents with weakness. Patient [facility] resident of [facility] community. He has not been his usual self with increased weakness and also is more confused. Apparently has been having a fever also. Patient was noted to be having a low blood pressure prior to transfer so was sent to the ED. In addition he has a history of anemia due to diverticulosis and has been receiving transfusions as needed. When he presented here to the ED he was noted to have a UTI and is being admitted for this.   Pt seen for MST. BMI indicates normal weight status. Pt unable to provide information due to hx of dementia. No family or visitors present. Spoke with tech who reports that pt refused breakfast this AM and pursed lips when she attempted to move food toward his mouth. She states that pt has been agitated this AM and noted, upon entering his room, that he had mitten restraints in place; physical assessment not done at this time.   Unable to state at this time whether pt does or does not meet criteria for malnutrition.  Per weight hx review, pt has lost 20 lbs (14% body weight) in 6 months which is significant for time frame.   Per review of MD notes, pt was provided with nutrition supplement TID at facility. Will order Ensure Enlive TID here and  monitor for acceptance.  Not meeting needs. Medications reviewed. Labs reviewed; BUN elevated, GFR: 55.  Height:  Ht Readings from Last 1 Encounters:  09/07/14 5\' 6"  (1.676 m)    Weight:  Wt Readings from Last 1 Encounters:  09/07/14 126 lb 3.2 oz (57.244 kg)    Ideal Body Weight:  64.5 kg (kg)  Wt Readings from Last 10 Encounters:  09/07/14 126 lb 3.2 oz (57.244 kg)  04/22/14 146 lb 8 oz (66.452 kg)  01/14/12 150 lb 9.6 oz (68.312 kg)  01/05/12 150 lb (68.04 kg)  07/17/11 150 lb (68.04 kg)  07/06/11 138 lb 14.2 oz (63 kg)  06/28/11 148 lb 3.2 oz (67.223 kg)  10/20/10 154 lb 3.2 oz (69.945 kg)  07/18/10 152 lb 12.8 oz (69.31 kg)  05/11/10 151 lb (68.493 kg)    BMI:  Body mass index is 20.38 kg/(m^2).  Estimated Nutritional Needs:  Kcal:  1200-1400  Protein:  50-60 grams  Fluid:  2-2.2 L/day  Skin:  Wound (see comment) (Bruise to L finger)  Diet Order:  Diet heart healthy/carb modified Room service appropriate?: Yes; Fluid consistency:: Thin  EDUCATION NEEDS:  No education needs identified at this time   Intake/Output Summary (Last 24 hours) at 09/08/14 1000 Last data filed at 09/08/14 0900  Gross per 24 hour  Intake   1120 ml  Output      0 ml  Net   1120 ml    Last BM:  PTA  Jarome Matin, RD, LDN Inpatient Clinical Dietitian Pager # [phone] After hours/weekend pager # [phone]

## 2014-09-08 NOTE — Progress Notes (Signed)
Shift event note:   Notified by RN regarding continued rise in Troponin. Troponin 1.69 on admission w/o apparent c/o CP and EKG w/o acute changes. Pt has continued to rest through-out the night in no apparent distress and w/ VS WNL. Most recent troponin 4.23. Consulted w/ Dr Marlowe Sax w/ cardiology service who recommended no aggressive intervention given pt's current age, health and DNR status. Suggested we could add ASA (325 mg initially then 81 mg QD). I also spoke w/ pt's son Tobi Bastos) by phone who confirms he would not want aggressive management even in event of NSTEMI. The son acknowledges that pt has very poor QOL and his main focus for pt is that he is comfortable. He relates that pt's doctor believes his anemia may be coming from a GI bleed but unable to investigate because he does not want to put pt to sleep for colonoscopy. Given this information I will hold ASA for now given current and recent issues w/ anemia and ? GI bleed.  Assessment/Plan: 1. Elevated troponin: NSTEMI in setting of pt w/ significant h/o CAD s/p 4 vessel CABG in 1999. No obvious c/o or indication of CP. VSS. Will continue to cycle troponin's. Discussed pt w/ Dr Nehemiah Settle who is in agreement w/ plan. Will continue to monitor closely on telemetry.  Jeryl Columbia, Np-C [facility] Pager [phone]

## 2014-09-08 NOTE — Clinical Social Work Note (Signed)
Clinical Social Work Assessment  Patient Details  Name: Jordan Little MRN: [id number] Date of Birth: 1915-06-29  Date of referral:  09/08/14               Reason for consult:  Facility Placement                Permission sought to share information with:  Facility Art therapist granted to share information::  Yes, Verbal Permission Granted  Name::        Agency::     Relationship::     Contact Information:     Housing/Transportation Living arrangements for the past 2 months:  [place] of Information:  Adult Children Patient Interpreter Needed:  None Criminal Activity/Legal Involvement Pertinent to Current Situation/Hospitalization:  No - Comment as needed Significant Relationships:  Adult Children Lives with:  Facility Resident Do you feel safe going back to the place where you live?  Yes Need for family participation in patient care:  Yes (Comment)  Care giving concerns:  CSW received consult that patient was admitted from [facility] at [facility] SNF/Memory Care Unit.    Social Worker assessment / plan:  CSW confirmed with patient's daughter-in-law/son, Jordan Little (ph#: [phone]) that plan is for patient to return to [place] at discharge.   Employment status:  Retired Nurse, adult PT Recommendations:  Not assessed at this time Information / Referral to community resources:  [place]  Patient/Family's Response to care:  Patient's daughter-in-law expressed that they have been very pleased with the care he's been receiving at [facility], that he's recently moved into their Memory Care Unit/SNF.   Patient/Family's Understanding of and Emotional Response to Diagnosis, Current Treatment, and Prognosis:  Patient's family is requesting a comfort care approach. Patient has [employer], but would likely not participate with therapy.   Emotional Assessment Appearance:  Appears  stated age Attitude/Demeanor/Rapport:    Affect (typically observed):  Calm Orientation:  Oriented to Self Alcohol / Substance use:    Psych involvement (Current and /or in the community):     Discharge Needs  Concerns to be addressed:    Readmission within the last 30 days:    Current discharge risk:    Barriers to Discharge:      Standley Brooking, LCSW 09/08/2014, 1:45 PM

## 2014-09-08 NOTE — Progress Notes (Signed)
Patient's heart rhythm looked atrial fib on the monitor, HR at 70's. EKG done to confirm but showed sinus rhythm with PVC's, K. Schorr was made aware.

## 2014-09-08 NOTE — Progress Notes (Signed)
CRITICAL VALUE ALERT  Critical value received:  Troponin 2.07  Date of notification:  09/07/2014  Time of notification:  2200  Critical value read back:Yes.    Nurse who received alert:  L. Arnette Schaumann  MD notified (1st page):  Lamar Blinks NP  Time of first page:  2210  MD notified (2nd page):  Time of second page:  Responding MD:  Lamar Blinks NP  Time MD responded:  2212

## 2014-09-09 LAB — CBC
HEMATOCRIT: 27.7 % — AB (ref 39.0–52.0)
Hemoglobin: 8.6 g/dL — ABNORMAL LOW (ref 13.0–17.0)
MCH: 25.6 pg — AB (ref 26.0–34.0)
MCHC: 31 g/dL (ref 30.0–36.0)
MCV: 82.4 fL (ref 78.0–100.0)
Platelets: 134 10*3/uL — ABNORMAL LOW (ref 150–400)
RBC: 3.36 MIL/uL — ABNORMAL LOW (ref 4.22–5.81)
RDW: 16.1 % — ABNORMAL HIGH (ref 11.5–15.5)
WBC: 11.7 10*3/uL — AB (ref 4.0–10.5)

## 2014-09-09 LAB — URINE CULTURE
CULTURE: NO GROWTH
Colony Count: NO GROWTH

## 2014-09-09 LAB — BASIC METABOLIC PANEL
Anion gap: 11 (ref 5–15)
BUN: 29 mg/dL — AB (ref 6–20)
CALCIUM: 8.6 mg/dL — AB (ref 8.9–10.3)
CO2: 19 mmol/L — ABNORMAL LOW (ref 22–32)
Chloride: 110 mmol/L (ref 101–111)
Creatinine, Ser: 0.9 mg/dL (ref 0.61–1.24)
GFR calc Af Amer: >60 mL/min (ref >60)
GFR calc non Af Amer: >60 mL/min (ref >60)
Glucose, Bld: 95 mg/dL (ref 65–99)
POTASSIUM: 3.7 mmol/L (ref 3.5–5.1)
Sodium: 140 mmol/L (ref 135–145)

## 2014-09-09 LAB — GLUCOSE, CAPILLARY: GLUCOSE-CAPILLARY: 83 mg/dL (ref 65–99)

## 2014-09-09 LAB — PREPARE RBC (CROSSMATCH)

## 2014-09-09 MED ORDER — PNEUMOCOCCAL VAC POLYVALENT 25 MCG/0.5ML IJ INJ
0.5000 mL | INJECTION | INTRAMUSCULAR | Status: AC
  Administered 2014-09-11: 0.5 mL via INTRAMUSCULAR
  Filled 2014-09-09 (×3): qty 0.5

## 2014-09-09 NOTE — Progress Notes (Signed)
[facility] PROGRESS NOTE  Jordan Little YWV:[id number] DOB: February 11, 1916 DOA: 09/07/2014 PCP: [name], MD  Assessment/Plan:  Principal Problem:   UTI (lower urinary tract infection) - Currently being treated with ceftriaxone and improving on this regimen. - We'll obtain urine culture results currently pending  Active Problems:   Essential hypertension - Soft blood pressures as such was sent up will have to be held - Decrease beta blocker dose    Elevated troponin - Continue beta blocker at decreased dose - Given GI bleed history unable to provide aspirin    Dementia without behavioral disturbance - Stable    Anemia - improved after transfusion - Given cardiac history and GI history we'll plan on transfusing 1 unit crit blood cells  Code Status: DO NOT RESUSCITATE Family Communication: No family at bedside, discussed with son over phone Disposition Plan: pending improvement in condition, ok to receive blood per discussion with son   Consultants:  None  Procedures:  none  Antibiotics:  Rocephin   HPI/Subjective: Pt denies any chest pain, states he feels better  Objective: Filed Vitals:   09/09/14 1340  BP: 118/72  Pulse: 88  Temp: 97.9 F (36.6 C)  Resp: 16    Intake/Output Summary (Last 24 hours) at 09/09/14 1703 Last data filed at 09/09/14 1343  Gross per 24 hour  Intake   1995 ml  Output      0 ml  Net   1995 ml   Filed Weights   09/07/14 2032  Weight: 57.244 kg (126 lb 3.2 oz)    Exam:   General:  Pt in nad, alert and awake  Cardiovascular: s1 and s2 wnl, no rubs  Respiratory: cta bl, no wheezes  Abdomen: soft, ND, NT  Musculoskeletal: no cyanosis   Data Reviewed: Basic Metabolic Panel:  Recent Labs Lab 09/07/14 1638 09/07/14 2110 09/08/14 0243 09/09/14 0440  NA 141  --  138 140  K 3.6  --  3.6 3.7  CL 108  --  109 110  CO2 21*  --  21* 19*  GLUCOSE 139*  --  141* 95  BUN 25*  --  26* 29*  CREATININE 1.21  1.16 1.09 0.90  CALCIUM 8.8*  --  8.1* 8.6*   Liver Function Tests:  Recent Labs Lab 09/07/14 1638 09/08/14 0243  AST 34 40  ALT 13* 14*  ALKPHOS 90 71  BILITOT 0.6 0.6  PROT 6.5 5.5*  ALBUMIN 3.2* 2.6*   No results for input(s): LIPASE, AMYLASE in the last 168 hours. No results for input(s): AMMONIA in the last 168 hours. CBC:  Recent Labs Lab 09/07/14 1638 09/07/14 2110 09/08/14 0243 09/09/14 0440  WBC 11.8* 12.2* 15.9* 11.7*  NEUTROABS 10.6*  --   --   --   HGB 8.5* 7.7* 7.3* 8.6*  HCT 27.5* 24.6* 23.4* 27.7*  MCV 84.4 83.7 83.3 82.4  PLT 171 156 145* 134*   Cardiac Enzymes:  Recent Labs Lab 09/07/14 1638 09/07/14 2110 09/08/14 0243 09/08/14 0821  TROPONINI 1.62* 2.07* 4.23* 4.48*   BNP (last 3 results) No results for input(s): BNP in the last 8760 hours.  ProBNP (last 3 results) No results for input(s): PROBNP in the last 8760 hours.  CBG:  Recent Labs Lab 09/08/14 0726 09/09/14 0740  GLUCAP 113* 83    Recent Results (from the past 240 hour(s))  Urine culture     Status: None   Collection Time: 09/07/14  4:17 PM  Result Value Ref Range Status  Specimen Description URINE, CATHETERIZED  Final   Special Requests NONE  Final   Colony Count NO GROWTH Performed at [facility]   Final   Culture NO GROWTH Performed at [facility]   Final   Report Status 09/09/2014 FINAL  Final  MRSA PCR Screening     Status: None   Collection Time: 09/07/14 10:45 PM  Result Value Ref Range Status   MRSA by PCR NEGATIVE NEGATIVE Final    Comment:        The GeneXpert MRSA Assay (FDA approved for NASAL specimens only), is one component of a comprehensive MRSA colonization surveillance program. It is not intended to diagnose MRSA infection nor to guide or monitor treatment for MRSA infections.      Studies: No results found.  Scheduled Meds: . sodium chloride   Intravenous Once  . cefTRIAXone (ROCEPHIN)  IV  1 g Intravenous Q24H   . feeding supplement (ENSURE ENLIVE)  237 mL Oral TID BM  . folic acid  1 mg Oral Daily  . heparin  5,000 Units Subcutaneous 3 times per day  . iron polysaccharides  150 mg Oral Daily  . isosorbide mononitrate  60 mg Oral Daily  . metoprolol succinate  12.5 mg Oral Daily  . multivitamin with minerals  1 tablet Oral Daily  . pantoprazole  40 mg Oral Daily  . [START ON 09/10/2014] pneumococcal 23 valent vaccine  0.5 mL Intramuscular Tomorrow-1000  . senna-docusate  1 tablet Oral BID  . sodium chloride  3 mL Intravenous Q12H  . tamsulosin  0.4 mg Oral QPC supper  . thiamine  100 mg Oral Daily  . traMADol  50 mg Oral BID  . vitamin B-12  1,000 mcg Oral Daily  . [START ON 09/11/2014] Vitamin D (Ergocalciferol)  50,000 Units Oral Weekly   Continuous Infusions: . sodium chloride 50 mL/hr at 09/08/14 1744    Time spent: > 35 minutes   Velvet Bathe  [facility] Pager [phone]. If 7PM-7AM, please contact night-coverage at [email or web address], password [facility] 09/09/2014, 5:03 PM  LOS: 2 days

## 2014-09-09 NOTE — Evaluation (Signed)
Clinical/Bedside Swallow Evaluation Patient Details  Name: Jordan Little MRN: [id number] Date of Birth: 01-06-1916  Today's Date: 09/09/2014 Time: SLP Start Time (ACUTE ONLY): 9798 SLP Stop Time (ACUTE ONLY): 1315 SLP Time Calculation (min) (ACUTE ONLY): 20 min  Past Medical History:  Past Medical History  Diagnosis Date  . CAD (coronary artery disease)     Dr Gwenlyn Found  . Hyperlipidemia   . Hypertension   . Diverticulosis 2010    with colon bleed  . Personal history of renal calculi 2007  . Pneumonia 2007  . Diverticulitis 01/05/2012    [facility]  . Pernicious anemia   . Dementia   . Atherosclerosis   . Anemia     unspecified  . Prostate cancer     [facility], Dr. Gaynelle Arabian  . Inguinal hernia   . Ataxic gait    Past Surgical History:  Past Surgical History  Procedure Laterality Date  . Coronary angioplasty with stent placement  1999    Dr. Gwenlyn Found, 4 vessels  . Transurethral resection of prostate      Dr.   Marla Roe  . Prostate surgery      Dr. Gaynelle Arabian  . Laparoscopic appendectomy  07/06/2011    Procedure: APPENDECTOMY LAPAROSCOPIC;  Surgeon: Adin Hector, MD;  Location: [facility];  Service: General;  Laterality: N/A;  . Intramedullary (im) nail intertrochanteric Left 04/23/2014    Procedure: LEFT INTRAMEDULLARY (IM) NAIL INTERTROCHANTRIC ;  Surgeon: Alta Corning, MD;  Location: [facility];  Service: Orthopedics;  Laterality: Left;  . Joint replacement    . Fracture surgery  04/2014    L neck femur   HPI:  81 yo male adm to [facility] with AMS - found to has anemia and UTI.  PMH + for dementia, essential HTN, falls, FTT and UTI.  PMH + for falls with left hip fx s/p surgery  04/2014.  CXR showed bronchitic changes - nothing acute.  Swallow evaluation ordered.     Assessment / Plan / Recommendation Clinical Impression  Pt currently presents with symptoms of significant oral>pharyngeal dysphagia.  RN reports pt orally holding foods - requiring suctioning prn.   Xerostomic oral cavity noted with dried secretions adhered to soft/hard palate and dentition.  Secretions mixed with brown tinged material- ? food.  SLP assisted pt to brush his dentition and clear oral cavity.    RN provided pt with pain medicine with applesauce per his request.  Excessive oral holding noted with pt requiring max verbal and tactile *dry spoon* cues to seal lips and transit.  Poor awareness to boluses cause concern for oral sensory impairment.  Suspect component of cognitive based with acute exacerbation from current illness.  Liquid via cup/spoon swallowed more efficiently without overt indication of airway compromise.    Did not test solids due to current level of oral dysphagia.  Note FTT - ? baseline dysphagia.    Recommend to modify diet to full liquids with strict aspiration precautions.  Note CXR negative.  SLP to follow up for po tolerance, readiness for dietary advancement and family/pt education.  RN informed.     Aspiration Risk  Moderate    Diet Recommendation Thin (full liquids) only due to level of oral deficits  Medication Administration: Crushed with puree Compensations: Slow rate;Small sips/bites;Other (Comment) (check for oral pocketing)    Other  Recommendations Oral Care Recommendations: Oral care before and after PO   Follow Up Recommendations    TBD   Frequency  and Duration min 2x/week  2 weeks   Pertinent Vitals/Pain Afebrile, decreased      Swallow Study Prior Functional Status   see hhx    General Date of Onset: 09/09/14 Other Pertinent Information: 81 yo male adm to [facility] with AMS - found to has anemia and UTI.  PMH + for dementia, essential HTN, falls, FTT and UTI.  PMH + for falls with left hip fx s/p surgery  04/2014.  CXR showed bronchitic changes - nothing acute.  Swallow evaluation ordered.   Type of Study: Bedside swallow evaluation Diet Prior to this Study: Dysphagia 3 (soft);Thin liquids Temperature Spikes Noted: No Respiratory  Status: Room air History of Recent Intubation: No Behavior/Cognition: Alert;Cooperative;Pleasant mood Oral Cavity - Dentition: Adequate natural dentition/normal for age Self-Feeding Abilities: Needs assist Patient Positioning: Upright in bed Baseline Vocal Quality: Normal Volitional Cough: Cognitively unable to elicit Volitional Swallow: Unable to elicit    Oral/Motor/Sensory Function Overall Oral Motor/Sensory Function: Impaired (grossly weak but no focal CN deficits)   Ice Chips Ice chips: Not tested   Thin Liquid Thin Liquid: Impaired Presentation: Straw;Cup;Spoon Oral Phase Impairments: Reduced lingual movement/coordination;Impaired anterior to posterior transit;Reduced labial seal;Poor awareness of bolus Oral Phase Functional Implications: Prolonged oral transit;Oral holding Pharyngeal  Phase Impairments: Suspected delayed Swallow;Throat Clearing - Delayed Other Comments: cues to swallow helpful    Nectar Thick Nectar Thick Liquid: Not tested   Honey Thick Honey Thick Liquid: Not tested   Puree Puree: Impaired Presentation: Spoon Oral Phase Impairments: Reduced labial seal;Reduced lingual movement/coordination;Impaired anterior to posterior transit Oral Phase Functional Implications: Prolonged oral transit;Oral holding;Oral residue Pharyngeal Phase Impairments: Suspected delayed Swallow Other Comments: pt required verbal cues and dry spoon stimulation to swallow    Solid   GO    Solid: Not tested Other Comments: DNT due to level or oral deficit      Luanna Salk, [facility] SLP [phone]

## 2014-09-10 ENCOUNTER — Other Ambulatory Visit: Payer: Self-pay

## 2014-09-10 LAB — FOLATE RBC
Folate, Hemolysate: 359.4 ng/mL
Folate, RBC: 1438 ng/mL (ref >498)
HEMATOCRIT: 25 % — AB (ref 37.5–51.0)

## 2014-09-10 LAB — GLUCOSE, CAPILLARY
GLUCOSE-CAPILLARY: 149 mg/dL — AB (ref 65–99)
Glucose-Capillary: 84 mg/dL (ref 65–99)

## 2014-09-10 LAB — HEMOGLOBIN A1C
Hgb A1c MFr Bld: 5.7 % — ABNORMAL HIGH (ref 4.8–5.6)
MEAN PLASMA GLUCOSE: 117 mg/dL

## 2014-09-10 MED ORDER — CEPHALEXIN 500 MG PO CAPS
500.0000 mg | ORAL_CAPSULE | Freq: Two times a day (BID) | ORAL | Status: DC
  Administered 2014-09-10 – 2014-09-13 (×6): 500 mg via ORAL
  Filled 2014-09-10 (×7): qty 1

## 2014-09-10 MED ORDER — METOPROLOL TARTRATE 1 MG/ML IV SOLN
2.5000 mg | Freq: Once | INTRAVENOUS | Status: AC
  Administered 2014-09-10: 2.5 mg via INTRAVENOUS
  Filled 2014-09-10: qty 5

## 2014-09-10 MED ORDER — DEXTROSE 5 % IV SOLN
5.0000 mg/h | INTRAVENOUS | Status: DC
  Administered 2014-09-10 – 2014-09-12 (×3): 5 mg/h via INTRAVENOUS
  Filled 2014-09-10 (×3): qty 100

## 2014-09-10 MED ORDER — METOPROLOL SUCCINATE ER 50 MG PO TB24
50.0000 mg | ORAL_TABLET | Freq: Every day | ORAL | Status: DC
  Administered 2014-09-11: 50 mg via ORAL
  Filled 2014-09-10: qty 1

## 2014-09-10 MED ORDER — DILTIAZEM LOAD VIA INFUSION
10.0000 mg | Freq: Once | INTRAVENOUS | Status: AC
  Administered 2014-09-10: 10 mg via INTRAVENOUS
  Filled 2014-09-10: qty 10

## 2014-09-10 NOTE — Progress Notes (Signed)
ANTIBIOTIC CONSULT NOTE - INITIAL  Pharmacy Consult for ceftriaxone Indication: urinary tract infection  Allergies  Allergen Reactions  . Iodine     Throat swelling  . Dilaudid [Hydromorphone Hcl]     confusion  . Fentanyl     Confusion     Patient Measurements: Height: 5\' 6"  (167.6 cm) Weight: 126 lb 3.2 oz (57.244 kg) IBW/kg (Calculated) : 63.8  Vital Signs: Temp: 97.8 F (36.6 C) (06/10 0532) Temp Source: Axillary (06/10 0532) BP: 147/104 mmHg (06/10 0801) Pulse Rate: 143 (06/10 0801) Intake/Output from previous day: 06/09 0701 - 06/10 0700 In: 1510 [P.O.:210; I.V.:1250; IV Piggyback:50] Out: 200 [Urine:200] Intake/Output from this shift: Total I/O In: 480 [P.O.:480] Out: -   Labs:  Recent Labs  09/07/14 2110 09/08/14 0243 09/09/14 0440  WBC 12.2* 15.9* 11.7*  HGB 7.7* 7.3* 8.6*  PLT 156 145* 134*  CREATININE 1.16 1.09 0.90   Estimated Creatinine Clearance: 38.8 mL/min (by C-G formula based on Cr of 0.9). No results for input(s): VANCOTROUGH, VANCOPEAK, VANCORANDOM, GENTTROUGH, GENTPEAK, GENTRANDOM, TOBRATROUGH, TOBRAPEAK, TOBRARND, AMIKACINPEAK, AMIKACINTROU, AMIKACIN in the last 72 hours.   Microbiology: Recent Results (from the past 720 hour(s))  Urine culture     Status: None   Collection Time: 09/07/14  4:17 PM  Result Value Ref Range Status   Specimen Description URINE, CATHETERIZED  Final   Special Requests NONE  Final   Colony Count NO GROWTH Performed at [facility]   Final   Culture NO GROWTH Performed at [facility]   Final   Report Status 09/09/2014 FINAL  Final  MRSA PCR Screening     Status: None   Collection Time: 09/07/14 10:45 PM  Result Value Ref Range Status   MRSA by PCR NEGATIVE NEGATIVE Final    Comment:        The GeneXpert MRSA Assay (FDA approved for NASAL specimens only), is one component of a comprehensive MRSA colonization surveillance program. It is not intended to diagnose MRSA infection nor  to guide or monitor treatment for MRSA infections.     Medical History: Past Medical History  Diagnosis Date  . CAD (coronary artery disease)     Dr Gwenlyn Found  . Hyperlipidemia   . Hypertension   . Diverticulosis 2010    with colon bleed  . Personal history of renal calculi 2007  . Pneumonia 2007  . Diverticulitis 01/05/2012    [facility]  . Pernicious anemia   . Dementia   . Atherosclerosis   . Anemia     unspecified  . Prostate cancer     [facility], Dr. Gaynelle Arabian  . Inguinal hernia   . Ataxic gait     Medications:  Anti-infectives    Start     Dose/Rate Route Frequency Ordered Stop   09/08/14 1000  vancomycin (VANCOCIN) IVPB 1000 mg/200 mL premix  Status:  Discontinued     1,000 mg 200 mL/hr over 60 Minutes Intravenous Every 24 hours 09/07/14 1809 09/07/14 1935   09/07/14 2000  cefTRIAXone (ROCEPHIN) 1 g in dextrose 5 % 50 mL IVPB     1 g 100 mL/hr over 30 Minutes Intravenous Every 24 hours 09/07/14 1936     09/07/14 1815  vancomycin (VANCOCIN) IVPB 1000 mg/200 mL premix     1,000 mg 200 mL/hr over 60 Minutes Intravenous  Once 09/07/14 1809 09/07/14 1920   09/07/14 1800  piperacillin-tazobactam (ZOSYN) IVPB 3.375 g     3.375 g 12.5 mL/hr  over 240 Minutes Intravenous  Once 09/07/14 1748 09/07/14 1901     Assessment: 81 y.o. male with history of dementia chronic atrial fibrillation HTN presents with weakness. [facility] He has not been his usual self with increased weakness and also is more confused. Apparently has been having a fever also. Patient was noted to be having a low blood pressure prior to transfer so was sent to the ED. In addition he has a history of anemia due to diverticulosis and has been receiving transfusions as needed. When he presented here to the ED he was noted to have a UTI and is being admitted for this.  Pharmacy to dose ceftriaxone for same   Goal of Therapy:  Eradication of  infection Appropriate antibiotic dosing for indication and renal function  Plan:   Continue Ceftriaxone 1 g IV q24 hr  Pharmacy will follow at a distance, dose appropriate for renal function and indication  Dolly Rias RPh 09/10/2014, 1:02 PM Pager [phone]

## 2014-09-10 NOTE — Progress Notes (Signed)
[facility] PROGRESS NOTE  Jordan Little [id number]:[id number] DOB: 25-Oct-1915 DOA: 09/07/2014 PCP: [name], MD  Assessment/Plan:  Principal Problem:   UTI (lower urinary tract infection) - Currently being treated with ceftriaxone and improving on this regimen. - Patient had urinalysis which grew out many bacteria and had some small leukocytes but on urine culture it was reported he had no growth. Leukocytosis however is trending down on current antibiotics regimen. - Will transition to oral regimen  Atrial fibrillation - Patient at increased risk for falls and history of GI bleed as such we'll not anticoagulate - We'll increase patient's beta blocker - Patient required small doses of Lopressor given A. fib with RVR which has resolved currently per my discussion with RN  Active Problems:   Essential hypertension - Given increase in blood pressure will increase beta blocker dose    Elevated troponin - Continue beta blocker  - Given GI bleed history unable to provide aspirin    Dementia without behavioral disturbance - Stable    Anemia - improved after transfusion - Anemia improved after transfusion  Code Status: DO NOT RESUSCITATE Family Communication: No family at bedside, discussed with son over phone Disposition Plan: pending improvement in condition, ok to receive blood per discussion with son   Consultants:  None  Procedures:  none  Antibiotics:  Rocephin   HPI/Subjective: No new complaints reported today.   Objective: Filed Vitals:   09/10/14 1425  BP: 123/87  Pulse: 77  Temp: 97.4 F (36.3 C)  Resp: 18    Intake/Output Summary (Last 24 hours) at 09/10/14 1722 Last data filed at 09/10/14 1218  Gross per 24 hour  Intake   1890 ml  Output      0 ml  Net   1890 ml   Filed Weights   09/07/14 2032  Weight: 57.244 kg (126 lb 3.2 oz)    Exam:   General:  Pt in nad, alert and awake  Cardiovascular: s1 and s2 wnl, no  rubs  Respiratory: cta bl, no wheezes  Abdomen: soft, ND, NT  Musculoskeletal: no cyanosis   Data Reviewed: Basic Metabolic Panel:  Recent Labs Lab 09/07/14 1638 09/07/14 2110 09/08/14 0243 09/09/14 0440  NA 141  --  138 140  K 3.6  --  3.6 3.7  CL 108  --  109 110  CO2 21*  --  21* 19*  GLUCOSE 139*  --  141* 95  BUN 25*  --  26* 29*  CREATININE 1.21 1.16 1.09 0.90  CALCIUM 8.8*  --  8.1* 8.6*   Liver Function Tests:  Recent Labs Lab 09/07/14 1638 09/08/14 0243  AST 34 40  ALT 13* 14*  ALKPHOS 90 71  BILITOT 0.6 0.6  PROT 6.5 5.5*  ALBUMIN 3.2* 2.6*   No results for input(s): LIPASE, AMYLASE in the last 168 hours. No results for input(s): AMMONIA in the last 168 hours. CBC:  Recent Labs Lab 09/07/14 1638 09/07/14 2110 09/08/14 0243 09/09/14 0440  WBC 11.8* 12.2* 15.9* 11.7*  NEUTROABS 10.6*  --   --   --   HGB 8.5* 7.7* 7.3* 8.6*  HCT 27.5* 24.6*  25.0* 23.4* 27.7*  MCV 84.4 83.7 83.3 82.4  PLT 171 156 145* 134*   Cardiac Enzymes:  Recent Labs Lab 09/07/14 1638 09/07/14 2110 09/08/14 0243 09/08/14 0821  TROPONINI 1.62* 2.07* 4.23* 4.48*   BNP (last 3 results) No results for input(s): BNP in the last 8760 hours.  ProBNP (last 3 results)  No results for input(s): PROBNP in the last 8760 hours.  CBG:  Recent Labs Lab 09/08/14 0726 09/09/14 0740 09/10/14 0718 09/10/14 1147  GLUCAP 113* 83 84 149*    Recent Results (from the past 240 hour(s))  Urine culture     Status: None   Collection Time: 09/07/14  4:17 PM  Result Value Ref Range Status   Specimen Description URINE, CATHETERIZED  Final   Special Requests NONE  Final   Colony Count NO GROWTH Performed at [facility]   Final   Culture NO GROWTH Performed at [facility]   Final   Report Status 09/09/2014 FINAL  Final  MRSA PCR Screening     Status: None   Collection Time: 09/07/14 10:45 PM  Result Value Ref Range Status   MRSA by PCR NEGATIVE NEGATIVE  Final    Comment:        The GeneXpert MRSA Assay (FDA approved for NASAL specimens only), is one component of a comprehensive MRSA colonization surveillance program. It is not intended to diagnose MRSA infection nor to guide or monitor treatment for MRSA infections.      Studies: No results found.  Scheduled Meds: . sodium chloride   Intravenous Once  . cefTRIAXone (ROCEPHIN)  IV  1 g Intravenous Q24H  . feeding supplement (ENSURE ENLIVE)  237 mL Oral TID BM  . folic acid  1 mg Oral Daily  . heparin  5,000 Units Subcutaneous 3 times per day  . iron polysaccharides  150 mg Oral Daily  . isosorbide mononitrate  60 mg Oral Daily  . [START ON 09/11/2014] metoprolol succinate  50 mg Oral Daily  . multivitamin with minerals  1 tablet Oral Daily  . pantoprazole  40 mg Oral Daily  . pneumococcal 23 valent vaccine  0.5 mL Intramuscular Tomorrow-1000  . senna-docusate  1 tablet Oral BID  . sodium chloride  3 mL Intravenous Q12H  . tamsulosin  0.4 mg Oral QPC supper  . thiamine  100 mg Oral Daily  . traMADol  50 mg Oral BID  . vitamin B-12  1,000 mcg Oral Daily  . [START ON 09/11/2014] Vitamin D (Ergocalciferol)  50,000 Units Oral Weekly   Continuous Infusions: . sodium chloride 50 mL/hr at 09/10/14 1408    Time spent: > 35 minutes   Velvet Bathe  [facility] Pager [phone]. If 7PM-7AM, please contact night-coverage at [email or web address], password [facility] 09/10/2014, 5:22 PM  LOS: 3 days

## 2014-09-10 NOTE — Progress Notes (Signed)
Pt HR 140s sustaining BP 147/104, Resp 28.  MD notified.  Will continue to monitor.

## 2014-09-10 NOTE — Progress Notes (Signed)
Speech Language Pathology Treatment: Dysphagia  Patient Details Name: Jordan Little MRN: [id number] DOB: 03-17-1916 Today's Date: 09/10/2014 Time: 8309-4076 SLP Time Calculation (min) (ACUTE ONLY): 24 min  Assessment / Plan / Recommendation Clinical Impression  Pt with much improved mentation today resulting in improved swallow ability/airway protection.   SLP assisted pt to brush is dentition and educated him to importance of oral care.  Provided pt with upgraded diet texture to assess for readiness for advancement.  Pt self fed graham crackers, applesauce and juice.  Prolonged oral transiting with oral residuals on right noted - with pt awareness.  Use of puree was more helpful than liquids to clear residuals.  No s/s of aspiration noted.   Pt did demonstrate mildly increased work of breathing during intake (although pt denies) - therefore recommend advancing to soft/thin for energy conservation.  Intermittent supervision - assessing for oral residuals recommended.  Educated pt to recommendations, although given his cognitive deficits, uncertain of generalization ability.   SLP to follow up once more to assure tolerance and for family education.     HPI Other Pertinent Information: 81 yo male adm to [facility] with AMS - found to has anemia and UTI.  PMH + for dementia, essential HTN, falls, FTT and UTI.  PMH + for falls with left hip fx s/p surgery  04/2014.  CXR showed bronchitic changes - nothing acute.  Swallow evaluation ordered.     Pertinent Vitals Pain Assessment: 0-10  SLP Plan  Continue with current plan of care    Recommendations Diet recommendations: Dysphagia 3 (mechanical soft);Thin liquid Liquids provided via: Cup;Straw Medication Administration: Crushed with puree Supervision: Intermittent supervision to cue for compensatory strategies;Patient able to self feed Compensations: Slow rate;Small sips/bites;Other (Comment);Check for pocketing Postural Changes and/or Swallow  Maneuvers: Seated upright 90 degrees;Upright 30-60 min after meal              Oral Care Recommendations: Oral care BID Follow up Recommendations: None Plan: Continue with current plan of care    [place], [place] [facility] SLP [phone]

## 2014-09-10 NOTE — Progress Notes (Signed)
Pt's HR is sustaining in 140's >2 minutes. Fredirick Maudlin, NP on call notified. Awaiting orders. Will continue to monitor pt. Carnella [place] I

## 2014-09-11 DIAGNOSIS — I214 Non-ST elevation (NSTEMI) myocardial infarction: Secondary | ICD-10-CM | POA: Diagnosis not present

## 2014-09-11 LAB — GLUCOSE, CAPILLARY: GLUCOSE-CAPILLARY: 108 mg/dL — AB (ref 65–99)

## 2014-09-11 LAB — CBC
HCT: 25.8 % — ABNORMAL LOW (ref 39.0–52.0)
HEMOGLOBIN: 8.2 g/dL — AB (ref 13.0–17.0)
MCH: 26.4 pg (ref 26.0–34.0)
MCHC: 31.8 g/dL (ref 30.0–36.0)
MCV: 83 fL (ref 78.0–100.0)
PLATELETS: 155 10*3/uL (ref 150–400)
RBC: 3.11 MIL/uL — AB (ref 4.22–5.81)
RDW: 16.8 % — AB (ref 11.5–15.5)
WBC: 5.4 10*3/uL (ref 4.0–10.5)

## 2014-09-11 MED ORDER — METOPROLOL SUCCINATE ER 100 MG PO TB24
100.0000 mg | ORAL_TABLET | Freq: Every day | ORAL | Status: DC
  Administered 2014-09-12 – 2014-09-13 (×2): 100 mg via ORAL
  Filled 2014-09-11 (×2): qty 1

## 2014-09-11 NOTE — Progress Notes (Signed)
[facility] PROGRESS NOTE  Jordan Little QIW:[id number] DOB: 06-Jan-1916 DOA: 09/07/2014 PCP: [name], MD  Assessment/Plan:  Principal Problem:   UTI (lower urinary tract infection) - Currently being treated with ceftriaxone and improving on this regimen. - Patient had urinalysis which grew out many bacteria and had some small leukocytes but on urine culture had no growth. Leukocytosis however is trending down on current antibiotics regimen. - Will transition to oral regimen - If continued improvement we'll consider likely on monday  Atrial fibrillation - Patient at increased risk for falls and history of GI bleed as such we'll not anticoagulate - We'll increase patient's beta blocker - Patient was placed on Cardizem gtt, once heart rate is better controlled will plan on transitioning to short acting Cardizem  Active Problems:   Essential hypertension - Given increase in blood pressure will increase beta blocker dose    Elevated troponin - Continue beta blocker  - Given GI bleed history unable to provide aspirin    Dementia without behavioral disturbance - Stable    Anemia - improved after transfusion - Anemia improved after transfusion  Code Status: DO NOT RESUSCITATE Family Communication: No family at bedside, will discuss with son Disposition Plan: pending improvement in condition, ok to receive blood per discussion with son   Consultants:  None  Procedures:  none  Antibiotics:  Rocephin   HPI/Subjective: No new complaints reported today. Patient denies any chest palpitations  Objective: Filed Vitals:   09/11/14 1351  BP: 133/96  Pulse: 109  Temp: 97.7 F (36.5 C)  Resp: 20    Intake/Output Summary (Last 24 hours) at 09/11/14 1652 Last data filed at 09/11/14 1500  Gross per 24 hour  Intake   1624 ml  Output      0 ml  Net   1624 ml   Filed Weights   09/07/14 2032  Weight: 57.244 kg (126 lb 3.2 oz)    Exam:   General:  Pt in  nad, alert and awake  Cardiovascular: s1 and s2 wnl, no rubs  Respiratory: cta bl, no wheezes  Abdomen: soft, ND, NT  Musculoskeletal: no cyanosis   Data Reviewed: Basic Metabolic Panel:  Recent Labs Lab 09/07/14 1638 09/07/14 2110 09/08/14 0243 09/09/14 0440  NA 141  --  138 140  K 3.6  --  3.6 3.7  CL 108  --  109 110  CO2 21*  --  21* 19*  GLUCOSE 139*  --  141* 95  BUN 25*  --  26* 29*  CREATININE 1.21 1.16 1.09 0.90  CALCIUM 8.8*  --  8.1* 8.6*   Liver Function Tests:  Recent Labs Lab 09/07/14 1638 09/08/14 0243  AST 34 40  ALT 13* 14*  ALKPHOS 90 71  BILITOT 0.6 0.6  PROT 6.5 5.5*  ALBUMIN 3.2* 2.6*   No results for input(s): LIPASE, AMYLASE in the last 168 hours. No results for input(s): AMMONIA in the last 168 hours. CBC:  Recent Labs Lab 09/07/14 1638 09/07/14 2110 09/08/14 0243 09/09/14 0440 09/11/14 0600  WBC 11.8* 12.2* 15.9* 11.7* 5.4  NEUTROABS 10.6*  --   --   --   --   HGB 8.5* 7.7* 7.3* 8.6* 8.2*  HCT 27.5* 24.6*  25.0* 23.4* 27.7* 25.8*  MCV 84.4 83.7 83.3 82.4 83.0  PLT 171 156 145* 134* 155   Cardiac Enzymes:  Recent Labs Lab 09/07/14 1638 09/07/14 2110 09/08/14 0243 09/08/14 0821  TROPONINI 1.62* 2.07* 4.23* 4.48*   BNP (  last 3 results) No results for input(s): BNP in the last 8760 hours.  ProBNP (last 3 results) No results for input(s): PROBNP in the last 8760 hours.  CBG:  Recent Labs Lab 09/08/14 0726 09/09/14 0740 09/10/14 0718 09/10/14 1147 09/11/14 0757  GLUCAP 113* 83 84 149* 108*    Recent Results (from the past 240 hour(s))  Urine culture     Status: None   Collection Time: 09/07/14  4:17 PM  Result Value Ref Range Status   Specimen Description URINE, CATHETERIZED  Final   Special Requests NONE  Final   Colony Count NO GROWTH Performed at [facility]   Final   Culture NO GROWTH Performed at [facility]   Final   Report Status 09/09/2014 FINAL  Final  MRSA PCR Screening      Status: None   Collection Time: 09/07/14 10:45 PM  Result Value Ref Range Status   MRSA by PCR NEGATIVE NEGATIVE Final    Comment:        The GeneXpert MRSA Assay (FDA approved for NASAL specimens only), is one component of a comprehensive MRSA colonization surveillance program. It is not intended to diagnose MRSA infection nor to guide or monitor treatment for MRSA infections.      Studies: No results found.  Scheduled Meds: . sodium chloride   Intravenous Once  . cephALEXin  500 mg Oral Q12H  . feeding supplement (ENSURE ENLIVE)  237 mL Oral TID BM  . folic acid  1 mg Oral Daily  . heparin  5,000 Units Subcutaneous 3 times per day  . iron polysaccharides  150 mg Oral Daily  . isosorbide mononitrate  60 mg Oral Daily  . [START ON 09/12/2014] metoprolol succinate  100 mg Oral Daily  . multivitamin with minerals  1 tablet Oral Daily  . pantoprazole  40 mg Oral Daily  . senna-docusate  1 tablet Oral BID  . sodium chloride  3 mL Intravenous Q12H  . tamsulosin  0.4 mg Oral QPC supper  . thiamine  100 mg Oral Daily  . traMADol  50 mg Oral BID  . vitamin B-12  1,000 mcg Oral Daily  . Vitamin D (Ergocalciferol)  50,000 Units Oral Weekly   Continuous Infusions: . sodium chloride 50 mL/hr at 09/11/14 1222  . diltiazem (CARDIZEM) infusion 5 mg/hr (09/11/14 1222)    Time spent: > 35 minutes   Velvet Bathe  [facility] Pager [phone]. If 7PM-7AM, please contact night-coverage at [email or web address], password [facility] 09/11/2014, 4:52 PM  LOS: 4 days

## 2014-09-12 LAB — TYPE AND SCREEN
ABO/RH(D): O POS
ANTIBODY SCREEN: NEGATIVE
UNIT DIVISION: 0
Unit division: 0

## 2014-09-12 LAB — GLUCOSE, CAPILLARY: GLUCOSE-CAPILLARY: 94 mg/dL (ref 65–99)

## 2014-09-12 NOTE — Progress Notes (Signed)
[facility] PROGRESS NOTE  Jordan Little [id number]:[id number] DOB: [birth year] DOA: 09/07/2014 PCP: [name], MD  Assessment/Plan:  Principal Problem:   UTI (lower urinary tract infection) - Currently being treated with ceftriaxone and improving on this regimen. - Patient had urinalysis which grew out many bacteria and had some small leukocytes but on urine culture had no growth. Leukocytosis however is trending down on current antibiotics regimen. - continue oral antibiotic regimen - If continued improvement we'll consider likely on monday  Atrial fibrillation - Patient at increased risk for falls and history of GI bleed as such we'll not anticoagulate - Cardizem gtt discontinued. Patient placed back on home B blocker regimen.  Last HR per my discussion with nursing at 1400 in the 80's  Active Problems:   Essential hypertension - Given increase in blood pressure will increase beta blocker dose    Elevated troponin - Continue beta blocker  - Given GI bleed history unable to provide aspirin    Dementia without behavioral disturbance - Stable    Anemia - improved after transfusion - Anemia improved after transfusion  Code Status: DO NOT RESUSCITATE Family Communication: No family at bedside, will discuss with son Disposition Plan: Pending improvement in condition, ok to receive blood per discussion with son   Consultants:  None  Procedures:  none  Antibiotics:  Rocephin   HPI/Subjective: No new complaints reported today. Patient denies any chest palpitations  Objective: Filed Vitals:   09/12/14 0414  BP: 136/70  Pulse: 85  Temp: 97.7 F (36.5 C)  Resp: 20    Intake/Output Summary (Last 24 hours) at 09/12/14 1411 Last data filed at 09/12/14 1303  Gross per 24 hour  Intake   1860 ml  Output      0 ml  Net   1860 ml   Filed Weights   09/07/14 2032  Weight: 57.244 kg (126 lb 3.2 oz)    Exam:   General:  Pt in nad, alert and  awake  Cardiovascular: s1 and s2 wnl, no rubs  Respiratory: cta bl, no wheezes  Abdomen: soft, ND, NT  Musculoskeletal: no cyanosis   Data Reviewed: Basic Metabolic Panel:  Recent Labs Lab 09/07/14 1638 09/07/14 2110 09/08/14 0243 09/09/14 0440  NA 141  --  138 140  K 3.6  --  3.6 3.7  CL 108  --  109 110  CO2 21*  --  21* 19*  GLUCOSE 139*  --  141* 95  BUN 25*  --  26* 29*  CREATININE 1.21 1.16 1.09 0.90  CALCIUM 8.8*  --  8.1* 8.6*   Liver Function Tests:  Recent Labs Lab 09/07/14 1638 09/08/14 0243  AST 34 40  ALT 13* 14*  ALKPHOS 90 71  BILITOT 0.6 0.6  PROT 6.5 5.5*  ALBUMIN 3.2* 2.6*   No results for input(s): LIPASE, AMYLASE in the last 168 hours. No results for input(s): AMMONIA in the last 168 hours. CBC:  Recent Labs Lab 09/07/14 1638 09/07/14 2110 09/08/14 0243 09/09/14 0440 09/11/14 0600  WBC 11.8* 12.2* 15.9* 11.7* 5.4  NEUTROABS 10.6*  --   --   --   --   HGB 8.5* 7.7* 7.3* 8.6* 8.2*  HCT 27.5* 24.6*  25.0* 23.4* 27.7* 25.8*  MCV 84.4 83.7 83.3 82.4 83.0  PLT 171 156 145* 134* 155   Cardiac Enzymes:  Recent Labs Lab 09/07/14 1638 09/07/14 2110 09/08/14 0243 09/08/14 0821  TROPONINI 1.62* 2.07* 4.23* 4.48*   BNP (last 3 results)  No results for input(s): BNP in the last 8760 hours.  ProBNP (last 3 results) No results for input(s): PROBNP in the last 8760 hours.  CBG:  Recent Labs Lab 09/09/14 0740 09/10/14 0718 09/10/14 1147 09/11/14 0757 09/12/14 0727  GLUCAP 83 84 149* 108* 94    Recent Results (from the past 240 hour(s))  Urine culture     Status: None   Collection Time: 09/07/14  4:17 PM  Result Value Ref Range Status   Specimen Description URINE, CATHETERIZED  Final   Special Requests NONE  Final   Colony Count NO GROWTH Performed at [facility]   Final   Culture NO GROWTH Performed at [facility]   Final   Report Status 09/09/2014 FINAL  Final  MRSA PCR Screening     Status: None    Collection Time: 09/07/14 10:45 PM  Result Value Ref Range Status   MRSA by PCR NEGATIVE NEGATIVE Final    Comment:        The GeneXpert MRSA Assay (FDA approved for NASAL specimens only), is one component of a comprehensive MRSA colonization surveillance program. It is not intended to diagnose MRSA infection nor to guide or monitor treatment for MRSA infections.      Studies: No results found.  Scheduled Meds: . sodium chloride   Intravenous Once  . cephALEXin  500 mg Oral Q12H  . feeding supplement (ENSURE ENLIVE)  237 mL Oral TID BM  . folic acid  1 mg Oral Daily  . heparin  5,000 Units Subcutaneous 3 times per day  . iron polysaccharides  150 mg Oral Daily  . isosorbide mononitrate  60 mg Oral Daily  . metoprolol succinate  100 mg Oral Daily  . multivitamin with minerals  1 tablet Oral Daily  . pantoprazole  40 mg Oral Daily  . senna-docusate  1 tablet Oral BID  . sodium chloride  3 mL Intravenous Q12H  . tamsulosin  0.4 mg Oral QPC supper  . thiamine  100 mg Oral Daily  . traMADol  50 mg Oral BID  . vitamin B-12  1,000 mcg Oral Daily  . Vitamin D (Ergocalciferol)  50,000 Units Oral Weekly   Continuous Infusions: . sodium chloride 50 mL/hr at 09/12/14 0607    Time spent: > 35 minutes   Velvet Bathe  [facility] Pager [phone]. If 7PM-7AM, please contact night-coverage at [email or web address], password [facility] 09/12/2014, 2:11 PM  LOS: 5 days

## 2014-09-13 LAB — GLUCOSE, CAPILLARY: Glucose-Capillary: 95 mg/dL (ref 65–99)

## 2014-09-13 MED ORDER — CEPHALEXIN 500 MG PO CAPS: 500.0000 mg | ORAL_CAPSULE | Freq: Two times a day (BID) | ORAL | Status: AC

## 2014-09-13 NOTE — Progress Notes (Signed)
Report called to Berneta Sages, RN at [facility].

## 2014-09-13 NOTE — Discharge Summary (Signed)
Physician Discharge Summary  Jordan Little:[id number] DOB: [birth year] DOA: 09/07/2014  PCP: [name], MD  Admit date: 09/07/2014 Discharge date: 09/13/2014  Time spent: 35 minutes  Recommendations for Outpatient Follow-up:  1. Continue physical therapy while at facility 2. Recommend patient follow-up with primary care physician within the next 1-2 weeks 3. continue antibiotics for 2 more days of complete and 8 day treatment regimen 4. Monitor CBC within the next one week after discharge  Discharge Diagnoses:  Principal Problem:   UTI (lower urinary tract infection) Active Problems:   Essential hypertension   Elevated troponin   Dementia without behavioral disturbance   Anemia   Discharge Condition: Stable  Diet recommendation: Heart healthy  Filed Weights   09/07/14 2032  Weight: 57.244 kg (126 lb 3.2 oz)    History of present illness:  From original history of present illness: 81 y.o. male with history of dementia chronic atrial fibrillation HTN presents with weakness. [facility] He has not been his usual self with increased weakness and also is more confused. Apparently has been having a fever also. Patient was noted to be having a low blood pressure prior to transfer so was sent to the ED  Hospital Course:  Principal Problem:  UTI (lower urinary tract infection) - Patient had urinalysis which grew out many bacteria and had some small leukocytes but on urine culture had no growth. Leukocytosis however is trending down on current antibiotics regimen. - Continue oral antibiotic regimen for 2 more days after d/c  Atrial fibrillation - Patient at increased risk for falls and history of GI bleed as such we'll not anticoagulate - We'll discharge patient back on home beta blocker regimen. Heart rate well controlled on this regimen with last heart rate recorded at 66.  Active Problems:  Essential hypertension - Continue home beta  blocker regimen   Elevated troponin - Continue beta blocker  - Given GI bleed history unable to provide aspirin - NSTEMI this admission but given age and debility only treated medically   Dementia without behavioral disturbance - Stable   Anemia - improved after transfusion - Anemia improved after transfusion   Procedures:  None  Consultations:  None  Discharge Exam: Filed Vitals:   09/13/14 1348  BP: 130/67  Pulse: 66  Temp: 97.6 F (36.4 C)  Resp: 18    General: pt in nad, alert and awake Cardiovascular: s1 and s2 present, no rubs Respiratory: cta bl, no wheezes  Discharge Instructions   Discharge Instructions    Call MD for:  difficulty breathing, headache or visual disturbances    Complete by:  As directed      Call MD for:  persistant dizziness or light-headedness    Complete by:  As directed      Call MD for:  redness, tenderness, or signs of infection (pain, swelling, redness, odor or green/yellow discharge around incision site)    Complete by:  As directed      Call MD for:  severe uncontrolled pain    Complete by:  As directed      Call MD for:  temperature >100.4    Complete by:  As directed      Diet - low sodium heart healthy    Complete by:  As directed      Discharge instructions    Complete by:  As directed   Please have primary care physician monitor patient while at SNF after post hospital discharge within 1 or  2 weeks. Also patient is to continue antibiotics for 2 more days after discharge. Would also continue physical therapy once patient transitions home.     Increase activity slowly    Complete by:  As directed           Current Discharge Medication List    START taking these medications   Details  cephALEXin (KEFLEX) 500 MG capsule Take 1 capsule (500 mg total) by mouth every 12 (twelve) hours. Qty: 4 capsule, Refills: 0      CONTINUE these medications which have NOT CHANGED   Details  acetaminophen (TYLENOL) 325 MG  tablet Take 650 mg by mouth every 4 (four) hours as needed for mild pain or fever.    acetaminophen (TYLENOL) 650 MG suppository Place 650 mg rectally every 4 (four) hours as needed for mild pain or fever.    ergocalciferol (VITAMIN D2) 50000 UNITS capsule Take 50,000 Units by mouth once a week. Saturday.    glucosamine-chondroitin 500-400 MG tablet Take 2 tablets by mouth daily.     iron polysaccharides (NIFEREX) 150 MG capsule Take 150 mg by mouth daily.    isosorbide mononitrate (IMDUR) 60 MG 24 hr tablet Take 60 mg by mouth daily.      lisinopril (PRINIVIL,ZESTRIL) 10 MG tablet Take 10 mg by mouth 2 (two) times daily.    Menthol, Topical Analgesic, 4 % GEL Apply 1 inch topically daily as needed (hip/knee pain).    metoprolol succinate (TOPROL-XL) 100 MG 24 hr tablet Take 100 mg by mouth daily. Take with or immediately following a meal.    NUTRITIONAL SUPPLEMENTS PO Take 1 Container by mouth 3 (three) times daily with meals. Offer supplement of choice 3 times daily with meals.    pantoprazole (PROTONIX) 40 MG tablet Take 40 mg by mouth daily.    senna-docusate (SENOKOT-S) 8.6-50 MG per tablet Take 1 tablet by mouth 2 (two) times daily.    tamsulosin (FLOMAX) 0.4 MG CAPS capsule Take 1 capsule (0.4 mg total) by mouth daily after supper. Qty: 30 capsule    vitamin B-12 (CYANOCOBALAMIN) 1000 MCG tablet Take 1,000 mcg by mouth daily.    enoxaparin (LOVENOX) 40 MG/0.4ML injection Inject 0.4 mLs (40 mg total) into the skin daily. Qty: 21 Syringe, Refills: 0    oxybutynin (DITROPAN-XL) 10 MG 24 hr tablet TAKE ONE TABLET BY MOUTH ONE TIME DAILY Qty: 30 tablet, Refills: 0      STOP taking these medications     traMADol (ULTRAM) 50 MG tablet      traMADol (ULTRAM) 50 MG tablet      HYDROcodone-acetaminophen (NORCO) 5-325 MG per tablet        Allergies  Allergen Reactions  . Iodine     Throat swelling  . Dilaudid [Hydromorphone Hcl]     confusion  . Fentanyl      Confusion       The results of significant diagnostics from this hospitalization (including imaging, microbiology, ancillary and laboratory) are listed below for reference.    Significant Diagnostic Studies: Dg Chest Portable 1 View  09/07/2014   CLINICAL DATA:  Increasing weakness and decreased activity over past 24 hours, fever of 101 degrees this afternoon, history hypertension, coronary artery disease, dementia, prostate cancer, former smoker  EXAM: PORTABLE CHEST - 1 VIEW  COMPARISON:  Portable exam 1636 hours compared to 06/24/2014  FINDINGS: Enlargement of cardiac silhouette with pulmonary vascular congestion.  Mediastinal contours and pulmonary vascularity normal.  Peribronchial thickening without infiltrate, pleural effusion  or pneumothorax.  Bones demineralized.  IMPRESSION: Enlargement of cardiac silhouette with pulmonary vascular congestion.  Minimal bronchitic changes.   Electronically Signed   By: Lavonia Dana M.D.   On: 09/07/2014 16:45    Microbiology: Recent Results (from the past 240 hour(s))  Urine culture     Status: None   Collection Time: 09/07/14  4:17 PM  Result Value Ref Range Status   Specimen Description URINE, CATHETERIZED  Final   Special Requests NONE  Final   Colony Count NO GROWTH Performed at [facility]   Final   Culture NO GROWTH Performed at [facility]   Final   Report Status 09/09/2014 FINAL  Final  MRSA PCR Screening     Status: None   Collection Time: 09/07/14 10:45 PM  Result Value Ref Range Status   MRSA by PCR NEGATIVE NEGATIVE Final    Comment:        The GeneXpert MRSA Assay (FDA approved for NASAL specimens only), is one component of a comprehensive MRSA colonization surveillance program. It is not intended to diagnose MRSA infection nor to guide or monitor treatment for MRSA infections.      Labs: Basic Metabolic Panel:  Recent Labs Lab 09/07/14 1638 09/07/14 2110 09/08/14 0243 09/09/14 0440  NA 141   --  138 140  K 3.6  --  3.6 3.7  CL 108  --  109 110  CO2 21*  --  21* 19*  GLUCOSE 139*  --  141* 95  BUN 25*  --  26* 29*  CREATININE 1.21 1.16 1.09 0.90  CALCIUM 8.8*  --  8.1* 8.6*   Liver Function Tests:  Recent Labs Lab 09/07/14 1638 09/08/14 0243  AST 34 40  ALT 13* 14*  ALKPHOS 90 71  BILITOT 0.6 0.6  PROT 6.5 5.5*  ALBUMIN 3.2* 2.6*   No results for input(s): LIPASE, AMYLASE in the last 168 hours. No results for input(s): AMMONIA in the last 168 hours. CBC:  Recent Labs Lab 09/07/14 1638 09/07/14 2110 09/08/14 0243 09/09/14 0440 09/11/14 0600  WBC 11.8* 12.2* 15.9* 11.7* 5.4  NEUTROABS 10.6*  --   --   --   --   HGB 8.5* 7.7* 7.3* 8.6* 8.2*  HCT 27.5* 24.6*  25.0* 23.4* 27.7* 25.8*  MCV 84.4 83.7 83.3 82.4 83.0  PLT 171 156 145* 134* 155   Cardiac Enzymes:  Recent Labs Lab 09/07/14 1638 09/07/14 2110 09/08/14 0243 09/08/14 0821  TROPONINI 1.62* 2.07* 4.23* 4.48*   BNP: BNP (last 3 results) No results for input(s): BNP in the last 8760 hours.  ProBNP (last 3 results) No results for input(s): PROBNP in the last 8760 hours.  CBG:  Recent Labs Lab 09/10/14 0718 09/10/14 1147 09/11/14 0757 09/12/14 0727 09/13/14 0727  GLUCAP 84 149* 108* 94 95       Signed:  Velvet Bathe  [facility] 09/13/2014, 2:18 PM

## 2014-09-13 NOTE — Progress Notes (Signed)
Patient is set to discharge back to [facility] today. Patient & son, Legrand Como aware. Discharge packet given to RN, Doroteo Bradford. [facility] called for transport.     Raynaldo Opitz, [facility] Clinical Social Worker cell #: [phone]

## 2014-09-14 ENCOUNTER — Telehealth: Payer: Self-pay | Admitting: *Deleted

## 2014-09-14 NOTE — Telephone Encounter (Signed)
Pt was on tcm list d/c 09/13/14 but was sent to SNF did not schedule tcm appt...Jordan Little

## 2014-12-02 DEATH — deceased

## 2015-04-21 IMAGING — DX DG HIP (WITH OR WITHOUT PELVIS) 2-3V*L*
3 series · 3 of 3 positions shown · non-contrast
Comparison: Intraoperative films of the left hip 04/23/2014 and
earlier.

CLINICAL DATA: [AGE] male with fall and confusion. Initial
encounter.

EXAM:
LEFT HIP (WITH PELVIS) 2-3 VIEWS

[pelvis ap]
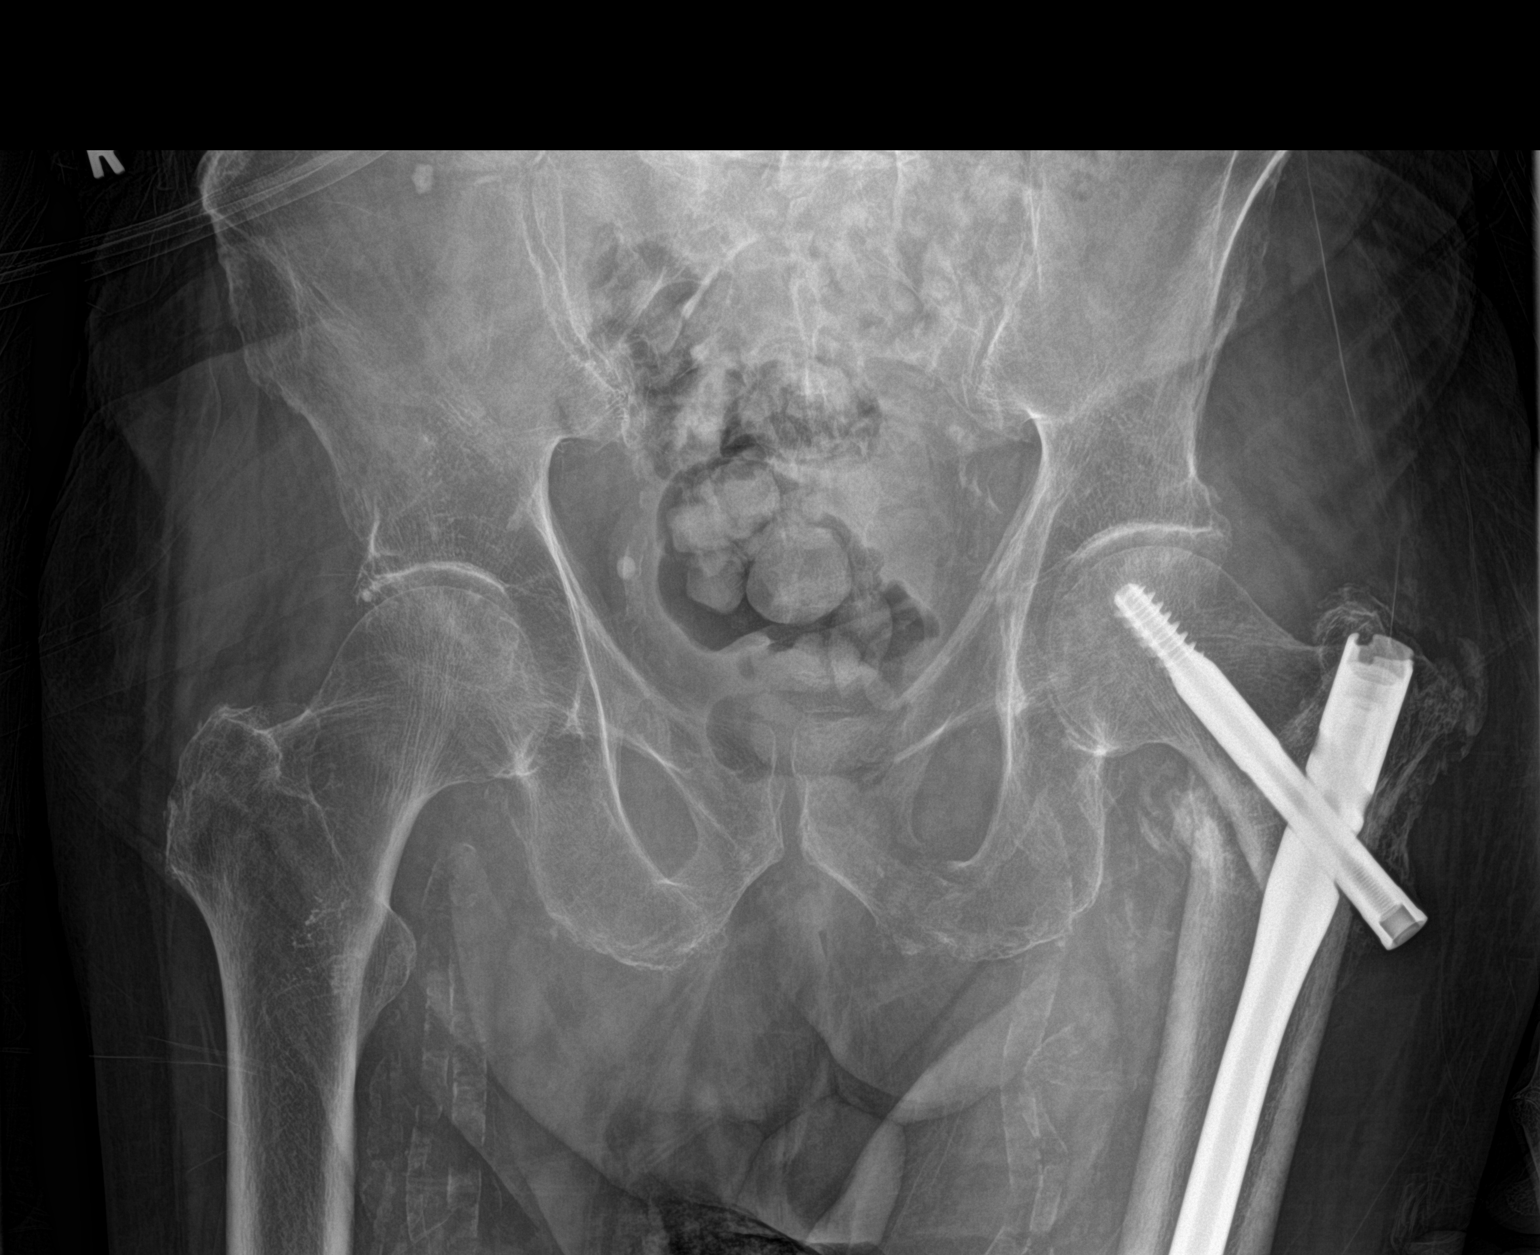

[hip ap]
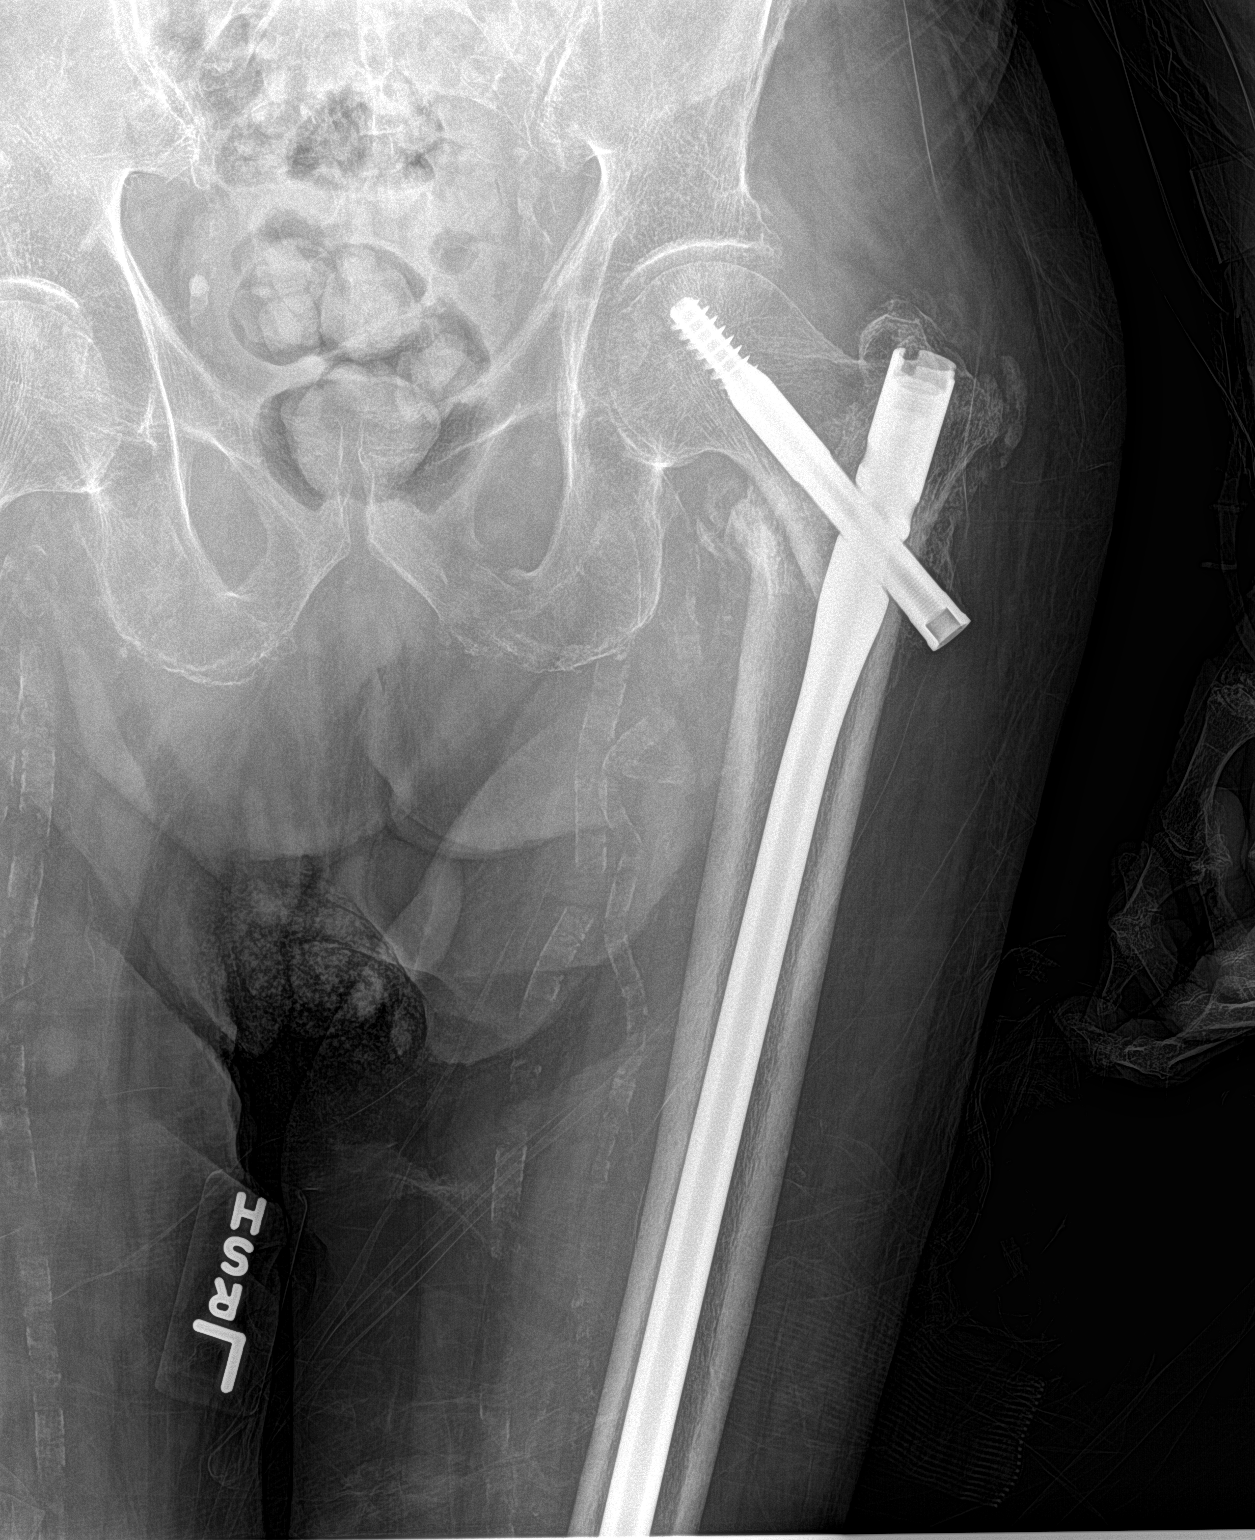

[hip lat]
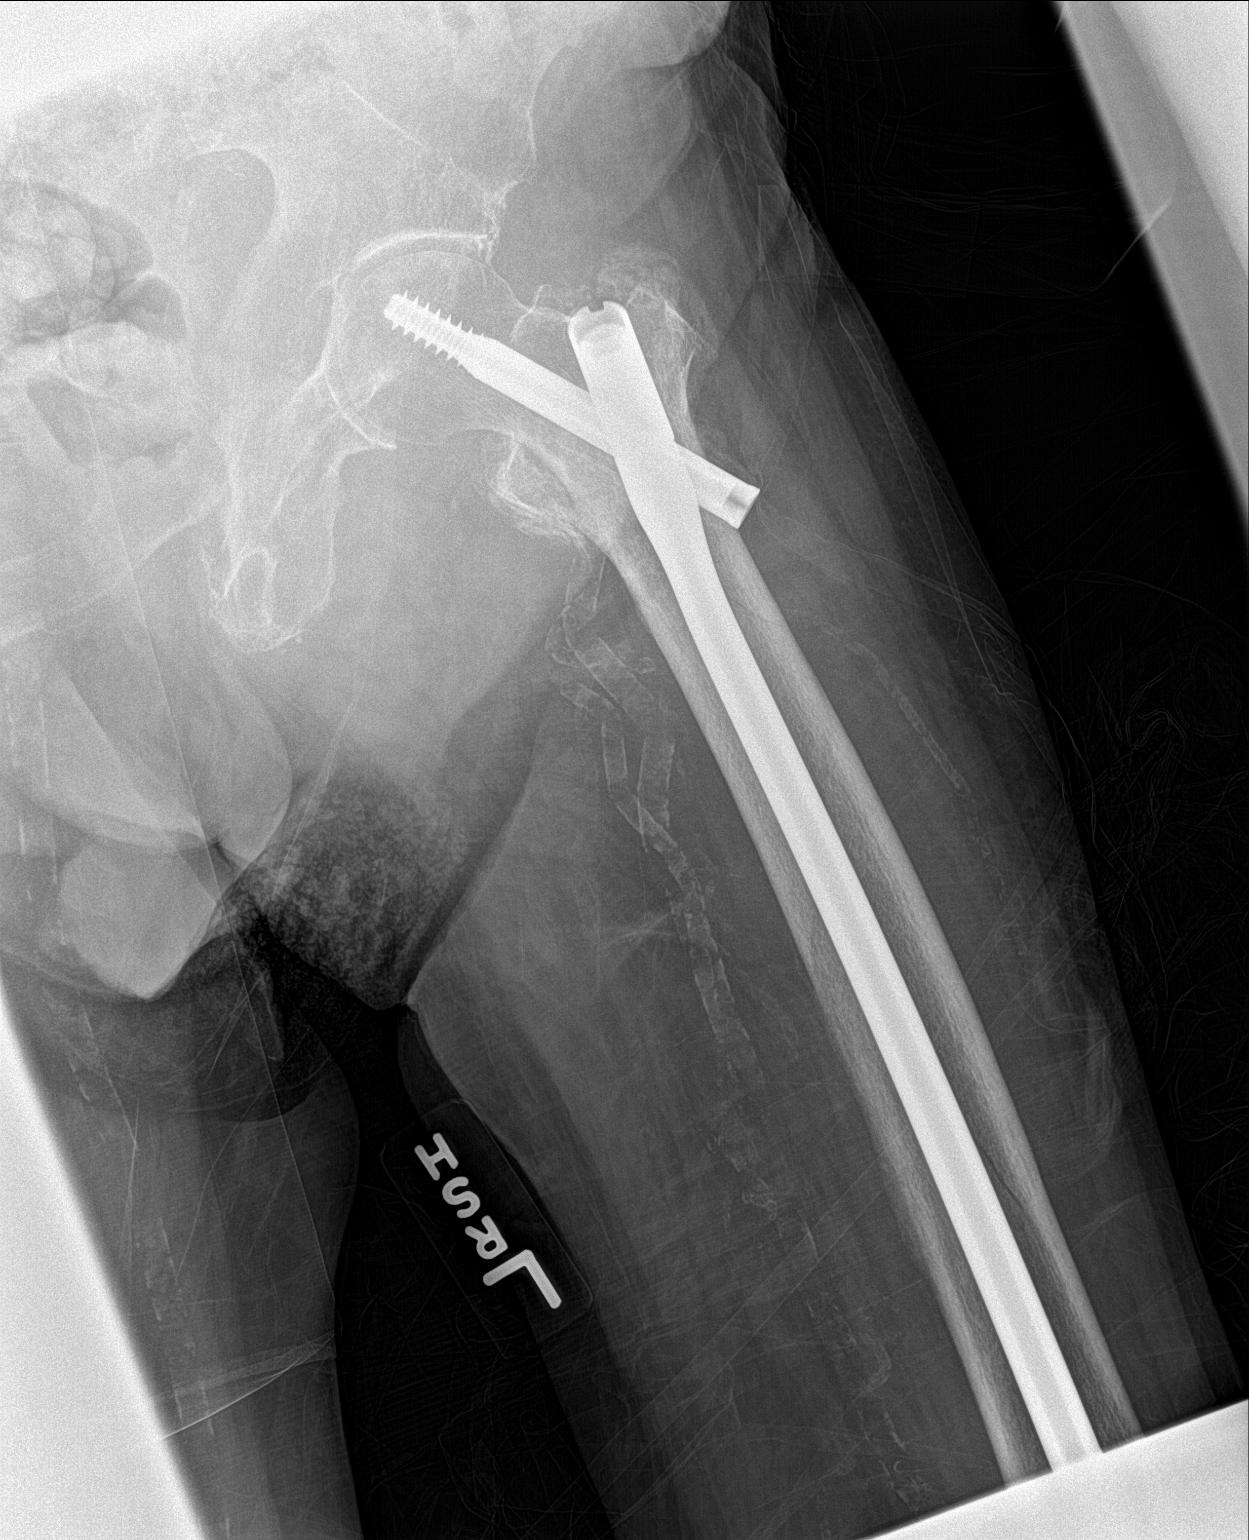

[3 of 3 positions shown; findings below may reference images not displayed]

FINDINGS: Sequelae of ORIF of the fractured proximal left femur. Fracture
fragment alignment and visible hardware appears stable from the
intraoperative images. There is periosteal reaction, but probably no
solid osseous union.

Underlying osteopenia. Extensive calcified atherosclerosis including
in both lower extremities.

Questionable nondisplaced left inferior pubic ramus fracture
(arrow). No other acute pelvic fracture is identified. The proximal
right femur is grossly intact. No new fracture of the visible left
femur
IMPRESSION: 1. Suggestion of nondisplaced inferior left pubic ramus fracture.
2. ORIF of the proximal left femur. Hardware and alignment appears
stable. Periosteal new bone but probably not yet solid osseous union
at that fracture site.
